# Patient Record
Sex: Male | Born: 1945 | Race: White | Hispanic: No | Marital: Married | Smoking: Never smoker
Health system: Southern US, Community
[De-identification: ages and names within clinical notes are randomized; demographics above are authoritative.]

## PROBLEM LIST (undated history)

## (undated) DIAGNOSIS — M1990 Unspecified osteoarthritis, unspecified site: Secondary | ICD-10-CM

## (undated) DIAGNOSIS — G4730 Sleep apnea, unspecified: Secondary | ICD-10-CM

## (undated) DIAGNOSIS — E785 Hyperlipidemia, unspecified: Secondary | ICD-10-CM

## (undated) DIAGNOSIS — I10 Essential (primary) hypertension: Secondary | ICD-10-CM

## (undated) HISTORY — DX: Hyperlipidemia, unspecified: E78.5

## (undated) HISTORY — PX: OTHER SURGICAL HISTORY: SHX169

---

## 2003-06-03 ENCOUNTER — Encounter: Admission: RE | Admit: 2003-06-03 | Discharge: 2003-06-03 | Payer: Self-pay | Admitting: Family Medicine

## 2008-03-26 ENCOUNTER — Encounter: Admission: RE | Admit: 2008-03-26 | Discharge: 2008-03-26 | Payer: Self-pay | Admitting: Family Medicine

## 2013-05-27 ENCOUNTER — Other Ambulatory Visit: Payer: Self-pay | Admitting: Gastroenterology

## 2013-07-07 ENCOUNTER — Encounter (HOSPITAL_COMMUNITY): Payer: Self-pay | Admitting: Pharmacy Technician

## 2013-07-16 ENCOUNTER — Encounter (HOSPITAL_COMMUNITY): Payer: Self-pay | Admitting: *Deleted

## 2013-07-28 ENCOUNTER — Ambulatory Visit (HOSPITAL_COMMUNITY)
Admission: RE | Admit: 2013-07-28 | Discharge: 2013-07-28 | Disposition: A | Discharge status: Home / Self Care | Payer: Medicare HMO | Source: Ambulatory Visit | Attending: Gastroenterology | Admitting: Gastroenterology

## 2013-07-28 ENCOUNTER — Encounter (HOSPITAL_COMMUNITY)
Admission: RE | Disposition: A | Discharge status: Home / Self Care | Payer: Self-pay | Source: Ambulatory Visit | Attending: Gastroenterology

## 2013-07-28 ENCOUNTER — Encounter (HOSPITAL_COMMUNITY): Payer: Medicare HMO | Admitting: Anesthesiology

## 2013-07-28 ENCOUNTER — Ambulatory Visit (HOSPITAL_COMMUNITY): Payer: Medicare HMO | Admitting: Anesthesiology

## 2013-07-28 ENCOUNTER — Encounter (HOSPITAL_COMMUNITY): Payer: Self-pay | Admitting: *Deleted

## 2013-07-28 DIAGNOSIS — M1990 Unspecified osteoarthritis, unspecified site: Secondary | ICD-10-CM | POA: Insufficient documentation

## 2013-07-28 DIAGNOSIS — G4700 Insomnia, unspecified: Secondary | ICD-10-CM | POA: Insufficient documentation

## 2013-07-28 DIAGNOSIS — I10 Essential (primary) hypertension: Secondary | ICD-10-CM | POA: Insufficient documentation

## 2013-07-28 DIAGNOSIS — L259 Unspecified contact dermatitis, unspecified cause: Secondary | ICD-10-CM | POA: Insufficient documentation

## 2013-07-28 DIAGNOSIS — E7800 Pure hypercholesterolemia, unspecified: Secondary | ICD-10-CM | POA: Insufficient documentation

## 2013-07-28 DIAGNOSIS — Z1211 Encounter for screening for malignant neoplasm of colon: Secondary | ICD-10-CM | POA: Insufficient documentation

## 2013-07-28 HISTORY — DX: Unspecified osteoarthritis, unspecified site: M19.90

## 2013-07-28 HISTORY — DX: Essential (primary) hypertension: I10

## 2013-07-28 HISTORY — PX: COLONOSCOPY WITH PROPOFOL: SHX5780

## 2013-07-28 SURGERY — COLONOSCOPY WITH PROPOFOL
Anesthesia: Monitor Anesthesia Care

## 2013-07-28 MED ORDER — SODIUM CHLORIDE 0.9 % IV SOLN: INTRAVENOUS | Status: DC

## 2013-07-28 MED ORDER — PROPOFOL 10 MG/ML IV BOLUS
INTRAVENOUS | Status: AC
  Filled 2013-07-28: qty 20

## 2013-07-28 MED ORDER — LIDOCAINE HCL (CARDIAC) 20 MG/ML IV SOLN
INTRAVENOUS | Status: AC
  Filled 2013-07-28: qty 5

## 2013-07-28 MED ORDER — PROPOFOL INFUSION 10 MG/ML OPTIME
INTRAVENOUS | Status: DC | PRN
  Administered 2013-07-28: 140 ug/kg/min via INTRAVENOUS

## 2013-07-28 MED ORDER — LACTATED RINGERS IV SOLN
INTRAVENOUS | Status: DC
  Administered 2013-07-28: 1000 mL via INTRAVENOUS

## 2013-07-28 MED ORDER — LACTATED RINGERS IV SOLN
INTRAVENOUS | Status: DC | PRN
  Administered 2013-07-28: 10:00:00 via INTRAVENOUS

## 2013-07-28 SURGICAL SUPPLY — 21 items

## 2013-07-28 NOTE — Op Note (Signed)
Procedure: Screening colonoscopy. Normal screening colonoscopy performed 09/20/2003.  Endoscopist: Earle Gell  Premedication: Propofol administered by anesthesia  Procedure: The patient was placed in the left lateral decubitus position. Anal inspection and digital rectal exam were normal. The Pentax pediatric colonoscope was introduced into the rectum and advanced to the cecum. A normal-appearing ileocecal valve and appendiceal orifice were identified. Colonic preparation for the exam today was good.  Rectum. Normal. Retroflexed view of the distal rectum normal  Sigmoid colon and descending colon. Normal  Splenic flexure. Normal  Transverse colon. Normal  Hepatic flexure. Normal  Ascending colon. Normal  Cecum and ileocecal valve. Normal  Assessment: Normal screening proctocolonoscopy to the cecum  Recommendation: Schedule repeat screening colonoscopy in 10 years if the patient's health allows.

## 2013-07-28 NOTE — Anesthesia Preprocedure Evaluation (Addendum)
Anesthesia Evaluation  Patient identified by MRN, date of birth, ID band Patient awake    Reviewed: Allergy & Precautions, H&P , NPO status , Patient's Chart, lab work & pertinent test results  Airway Mallampati: II TM Distance: >3 FB Neck ROM: full    Dental  (+) Caps, Dental Advisory Given All upper front teeth are capped:   Pulmonary neg pulmonary ROS,  breath sounds clear to auscultation  Pulmonary exam normal       Cardiovascular Exercise Tolerance: Good hypertension, Pt. on medications Rhythm:regular Rate:Normal     Neuro/Psych negative neurological ROS  negative psych ROS   GI/Hepatic negative GI ROS, Neg liver ROS,   Endo/Other  negative endocrine ROS  Renal/GU negative Renal ROS  negative genitourinary   Musculoskeletal   Abdominal   Peds  Hematology negative hematology ROS (+)   Anesthesia Other Findings   Reproductive/Obstetrics negative OB ROS                          Anesthesia Physical Anesthesia Plan  ASA: II  Anesthesia Plan: MAC   Post-op Pain Management:    Induction:   Airway Management Planned:   Additional Equipment:   Intra-op Plan:   Post-operative Plan:   Informed Consent: I have reviewed the patients History and Physical, chart, labs and discussed the procedure including the risks, benefits and alternatives for the proposed anesthesia with the patient or authorized representative who has indicated his/her understanding and acceptance.   Dental Advisory Given  Plan Discussed with: CRNA and Surgeon  Anesthesia Plan Comments:         Anesthesia Quick Evaluation

## 2013-07-28 NOTE — Anesthesia Postprocedure Evaluation (Signed)
  Anesthesia Post-op Note  Patient: Kevin Ramirez  Procedure(s) Performed: Procedure(s) (LRB): COLONOSCOPY WITH PROPOFOL (N/A)  Patient Location: PACU  Anesthesia Type: MAC  Level of Consciousness: awake and alert   Airway and Oxygen Therapy: Patient Spontanous Breathing  Post-op Pain: mild  Post-op Assessment: Post-op Vital signs reviewed, Patient's Cardiovascular Status Stable, Respiratory Function Stable, Patent Airway and No signs of Nausea or vomiting  Last Vitals:  Filed Vitals:   07/28/13 1037  BP: 116/73  Pulse:   Temp: 36.3 C  Resp: 12    Post-op Vital Signs: stable   Complications: No apparent anesthesia complications

## 2013-07-28 NOTE — Transfer of Care (Signed)
Immediate Anesthesia Transfer of Care Note  Patient: Kevin Ramirez  Procedure(s) Performed: Procedure(s): COLONOSCOPY WITH PROPOFOL (N/A)  Patient Location: PACU  Anesthesia Type:MAC  Level of Consciousness: sedated  Airway & Oxygen Therapy: Patient Spontanous Breathing and Patient connected to face mask oxygen  Post-op Assessment: Report given to PACU RN and Post -op Vital signs reviewed and stable  Post vital signs: Reviewed and stable  Complications: No apparent anesthesia complications

## 2013-07-28 NOTE — Discharge Instructions (Signed)
Colonoscopy A colonoscopy is an exam to look at the entire large intestine (colon). This exam can help find problems such as tumors, polyps, inflammation, and areas of bleeding. The exam takes about 1 hour.  LET [facility] CARE PROVIDER KNOW ABOUT:   Any allergies you have.  All medicines you are taking, including vitamins, herbs, eye drops, creams, and over-the-counter medicines.  Previous problems you or members of your family have had with the use of anesthetics.  Any blood disorders you have.  Previous surgeries you have had.  Medical conditions you have. RISKS AND COMPLICATIONS  Generally, this is a safe procedure. However, as with any procedure, complications can occur. Possible complications include:  Bleeding.  Tearing or rupture of the colon wall.  Reaction to medicines given during the exam.  Infection (rare). BEFORE THE PROCEDURE   Ask your health care provider about changing or stopping your regular medicines.  You may be prescribed an oral bowel prep. This involves drinking a large amount of medicated liquid, starting the day before your procedure. The liquid will cause you to have multiple loose stools until your stool is almost clear or light green. This cleans out your colon in preparation for the procedure.  Do not eat or drink anything else once you have started the bowel prep, unless your health care provider tells you it is safe to do so.  Arrange for someone to drive you home after the procedure. PROCEDURE   You will be given medicine to help you relax (sedative).  You will lie on your side with your knees bent.  A long, flexible tube with a light and camera on the end (colonoscope) will be inserted through the rectum and into the colon. The camera sends video back to a computer screen as it moves through the colon. The colonoscope also releases carbon dioxide gas to inflate the colon. This helps your health care provider see the area better.  During  the exam, your health care provider may take a small tissue sample (biopsy) to be examined under a microscope if any abnormalities are found.  The exam is finished when the entire colon has been viewed. AFTER THE PROCEDURE   Do not drive for 24 hours after the exam.  You may have a small amount of blood in your stool.  You may pass moderate amounts of gas and have mild abdominal cramping or bloating. This is caused by the gas used to inflate your colon during the exam.  Ask when your test results will be ready and how you will get your results. Make sure you get your test results. Document Released: 02/03/2000 Document Revised: 11/26/2012 Document Reviewed: 10/13/2012 [facility] Patient Information 2014 [place]. Monitored Anesthesia Care  Monitored anesthesia care is an anesthesia service for a medical procedure. Anesthesia is the loss of the ability to feel pain. It is produced by medications called anesthetics. It may affect a small area of your body (local anesthesia), a large area of your body (regional anesthesia), or your entire body (general anesthesia). The need for monitored anesthesia care depends your procedure, your condition, and the potential need for regional or general anesthesia. It is often provided during procedures where:   General anesthesia may be needed if there are complications. This is because you need special care when you are under general anesthesia.   You will be under local or regional anesthesia. This is so that you are able to have higher levels of anesthesia if needed.   You  will receive calming medications (sedatives). This is especially the case if sedatives are given to put you in a semi-conscious state of relaxation (deep sedation). This is because the amount of sedative needed to produce this state can be hard to predict. Too much of a sedative can produce general anesthesia. Monitored anesthesia care is performed by one or more caregivers who have  special training in all types of anesthesia. You will need to meet with these caregivers before your procedure. During this meeting, they will ask you about your medical history. They will also give you instructions to follow. (For example, you will need to stop eating and drinking before your procedure. You may also need to stop or change medications you are taking.) During your procedure, your caregivers will stay with you. They will:   Watch your condition. This includes watching you blood pressure, breathing, and level of pain.   Diagnose and treat problems that occur.   Give medications if they are needed. These may include calming medications (sedatives) and anesthetics.   Make sure you are comfortable.  Having monitored anesthesia care does not necessarily mean that you will be under anesthesia. It does mean that your caregivers will be able to manage anesthesia if you need it or if it occurs. It also means that you will be able to have a different type of anesthesia than you are having if you need it. When your procedure is complete, your caregivers will continue to watch your condition. They will make sure any medications wear off before you are allowed to go home.  Document Released: 11/01/2004 Document Revised: 06/02/2012 Document Reviewed: 03/19/2012 [facility] Patient Information 2014 [place], [place].

## 2013-07-28 NOTE — H&P (Signed)
Procedure: Screening colonoscopy. Normal screening colonoscopy performed on 09/20/2003  History: The patient is a 68 year old male born Apr 09, 1945 who is scheduled to undergo a repeat screening colonoscopy with polypectomy to prevent colon cancer.  Medication allergies:Hycodan causes nausea  Past medical history: Hypertension. Hypercholesterolemia. Eczema. Osteoarthritis. Insomnia. Chest surgery.  Exam: The patient is alert and lying comfortably on the endoscopy stretcher. Lungs are clear to auscultation. Cardiac exam reveals a regular rhythm. Abdomen is soft and nontender to palpation.  Plan: Proceed with screening colonoscopy

## 2013-07-29 ENCOUNTER — Encounter (HOSPITAL_COMMUNITY): Payer: Self-pay | Admitting: Gastroenterology

## 2014-05-29 ENCOUNTER — Ambulatory Visit (INDEPENDENT_AMBULATORY_CARE_PROVIDER_SITE_OTHER): Payer: Medicare HMO

## 2014-05-29 ENCOUNTER — Encounter: Payer: Self-pay | Admitting: Podiatry

## 2014-05-29 ENCOUNTER — Ambulatory Visit (INDEPENDENT_AMBULATORY_CARE_PROVIDER_SITE_OTHER): Payer: Medicare HMO | Admitting: Podiatry

## 2014-05-29 VITALS — BP 127/83 | HR 64 | Resp 18

## 2014-05-29 DIAGNOSIS — M6740 Ganglion, unspecified site: Secondary | ICD-10-CM

## 2014-05-29 DIAGNOSIS — R52 Pain, unspecified: Secondary | ICD-10-CM

## 2014-05-29 DIAGNOSIS — B351 Tinea unguium: Secondary | ICD-10-CM | POA: Diagnosis not present

## 2014-05-29 NOTE — Progress Notes (Signed)
Subjective:    Patient ID: Kevin Ramirez, male    DOB: [birth year], 69 y.o.   MRN: [id number]  HPI  69 year old male presents the office they with complaints of a growth on his left foot which has been present for approximate one month. He states that over time his been getting bigger and he has pain particularly with shoe gear and pressure. He denies any overlying skin changes. Denies any history of injury or trauma. He has a states that he has thick discolored toenails is present for several years. He states they're very brittle. Denies any pain associated with him and denies any redness or drainage. No other complaints at this time.    Review of Systems  All other systems reviewed and are negative.      Objective:   Physical Exam AAO 3, NAD DP/PT pulses palpable, CRT less than 3 seconds Protective sensation intact with Simms 1 2 monofilament, vibratory sensation intact, Achilles tendon reflex intact. On the dorsal aspect left foot on the forefoot there is a well encapsulated fluid-filled soft tissue mass measures proximally 1.2 x 1.2 cm. There is mild discomfort upon palpation this lesion. There is no overlying skin change. No overlying erythema, edema, increase in warmth. Nails are hypertrophic, dystrophic, elongated, brittle, discolored 10. There is no swelling erythema or drainage from the nail sites. No open lesions or pre-ulcer lesions identified bilaterally. No other areas of tenderness to bilateral lower extremities. MMT 5/5, ROM WNL No pain with calf compression, swelling, warmth, erythema.    Assessment & Plan:  69 year old male left foot soft tissue mass/ganglion cyst; onychodystrophy, likely onychomycosis -X-rays were obtained and reviewed with the patient -Treatment options discussed include alternatives, risks, competitions -In regards the soft tissue mass appears to be a ganglion cyst. I discussed aspiration with the patient including risks and complications for  which he understands and wishes to proceed. He probably consented. Under sterile conditions a total of 1.5 mL mixture of 2% lidocaine plain and 0.5% Marcaine plan was infiltrated in a regional block fashion around the soft tissue mass. Once anesthetized the skin was prepped with Betadine. Next an 18-gauge needle was utilized to aspirate the soft tissue mass was there was clear gel-like fluid aspirated. Appear to be a ganglion cyst. After aspiration a total of 0.5 mL of dexamethasone phosphate was infiltrated into the area. Bandage was applied followed by compression dressing. Post procedure care was discussed the patient. -In regards to the onychomycosis the nails were biopsied and sent to [facility] labs for evaluation. Discussed there is treatment options of onychomycosis with the patient however we will await the results of the biopsy before proceeding with treatment. -Follow-up after nail biopsy results are obtained or sooner if any problems are to arise. In the meantime encouraged to call the office with any questions, concerns, change in symptoms.

## 2014-05-29 NOTE — Patient Instructions (Addendum)
Ganglion Cyst A ganglion cyst is a noncancerous, fluid-filled lump that occurs near joints or tendons. The ganglion cyst grows out of a joint or the lining of a tendon. It most often develops in the hand or wrist but can also develop in the shoulder, elbow, hip, knee, ankle, or foot. The round or oval ganglion can be pea sized or larger than a grape. Increased activity may enlarge the size of the cyst because more fluid starts to build up.  CAUSES  It is not completely known what causes a ganglion cyst to grow. However, it may be related to: 1. Inflammation or irritation around the joint. 2. An injury. 3. Repetitive movements or overuse. 4. Arthritis. SYMPTOMS  A lump most often appears in the hand or wrist, but can occur in other areas of the body. Generally, the lump is painless without other symptoms. However, sometimes pain can be felt during activity or when pressure is applied to the lump. The lump may even be tender to the touch. Tingling, pain, numbness, or muscle weakness can occur if the ganglion cyst presses on a nerve. Your grip may be weak and you may have less movement in your joints.  DIAGNOSIS  Ganglion cysts are most often diagnosed based on a physical exam, noting where the cyst is and how it looks. Your caregiver will feel the lump and may shine a light alongside it. If it is a ganglion, a light often shines through it. Your caregiver may order an X-ray, ultrasound, or MRI to rule out other conditions. TREATMENT  Ganglions usually go away on their own without treatment. If pain or other symptoms are involved, treatment may be needed. Treatment is also needed if the ganglion limits your movement or if it gets infected. Treatment options include:  Wearing a wrist or finger brace or splint.  Taking anti-inflammatory medicine.  Draining fluid from the lump with a needle (aspiration).  Injecting a steroid into the joint.  Surgery to remove the ganglion cyst and its stalk that is  attached to the joint or tendon. However, ganglion cysts can grow back. HOME CARE INSTRUCTIONS   Do not press on the ganglion, poke it with a needle, or hit it with a heavy object. You may rub the lump gently and often. Sometimes fluid moves out of the cyst.  Only take medicines as directed by your caregiver.  Wear your brace or splint as directed by your caregiver. SEEK MEDICAL CARE IF:   Your ganglion becomes larger or more painful.  You have increased redness, red streaks, or swelling.  You have pus coming from the lump.  You have weakness or numbness in the affected area. MAKE SURE YOU:   Understand these instructions.  Will watch your condition.  Will get help right away if you are not doing well or get worse. Document Released: 02/03/2000 Document Revised: 10/31/2011 Document Reviewed: 04/01/2007 [facility] Patient Information 2015 [place], [place]. This information is not intended to replace advice given to you by your health care provider. Make sure you discuss any questions you have with your health care provider. Onychomycosis/Fungal Toenails  WHAT IS IT? An infection that lies within the keratin of your nail plate that is caused by a fungus.  WHY ME? Fungal infections affect all ages, sexes, races, and creeds.  There may be many factors that predispose you to a fungal infection such as age, coexisting medical conditions such as diabetes, or an autoimmune disease; stress, medications, fatigue, genetics, etc.  Bottom line: fungus  thrives in a warm, moist environment and your shoes offer such a location.  IS IT CONTAGIOUS? Theoretically, yes.  You do not want to share shoes, nail clippers or files with someone who has fungal toenails.  Walking around barefoot in the same room or sleeping in the same bed is unlikely to transfer the organism.  It is important to realize, however, that fungus can spread easily from one nail to the next on the same foot.  HOW DO WE TREAT THIS?   There are several ways to treat this condition.  Treatment may depend on many factors such as age, medications, pregnancy, liver and kidney conditions, etc.  It is best to ask your doctor which options are available to you.  5. No treatment.   Unlike many other medical concerns, you can live with this condition.  However for many people this can be a painful condition and may lead to ingrown toenails or a bacterial infection.  It is recommended that you keep the nails cut short to help reduce the amount of fungal nail. 6. Topical treatment.  These range from herbal remedies to prescription strength nail lacquers.  About 40-50% effective, topicals require twice daily application for approximately 9 to 12 months or until an entirely new nail has grown out.  The most effective topicals are medical grade medications available through physicians offices. 7. Oral antifungal medications.  With an 80-90% cure rate, the most common oral medication requires 3 to 4 months of therapy and stays in your system for a year as the new nail grows out.  Oral antifungal medications do require blood work to make sure it is a safe drug for you.  A liver function panel will be performed prior to starting the medication and after the first month of treatment.  It is important to have the blood work performed to avoid any harmful side effects.  In general, this medication safe but blood work is required. 8. Laser Therapy.  This treatment is performed by applying a specialized laser to the affected nail plate.  This therapy is noninvasive, fast, and non-painful.  It is not covered by insurance and is therefore, out of pocket.  The results have been very good with a 80-95% cure rate.  The [facility] is the only practice in the area to offer this therapy. 9. Permanent Nail Avulsion.  Removing the entire nail so that a new nail will not grow back.

## 2014-05-30 ENCOUNTER — Encounter: Payer: Self-pay | Admitting: Podiatry

## 2014-06-16 ENCOUNTER — Ambulatory Visit: Payer: Medicare HMO | Admitting: Podiatry

## 2014-07-05 ENCOUNTER — Encounter: Payer: Self-pay | Admitting: Podiatry

## 2014-07-05 ENCOUNTER — Ambulatory Visit (INDEPENDENT_AMBULATORY_CARE_PROVIDER_SITE_OTHER): Payer: Medicare HMO | Admitting: Podiatry

## 2014-07-05 VITALS — BP 139/85 | HR 61 | Resp 18

## 2014-07-05 DIAGNOSIS — M6740 Ganglion, unspecified site: Secondary | ICD-10-CM

## 2014-07-05 DIAGNOSIS — B351 Tinea unguium: Secondary | ICD-10-CM | POA: Diagnosis not present

## 2014-07-05 MED ORDER — TERBINAFINE HCL 250 MG PO TABS: 250.0000 mg | ORAL_TABLET | Freq: Every day | ORAL | Status: DC

## 2014-07-05 NOTE — Patient Instructions (Signed)

## 2014-07-07 ENCOUNTER — Telehealth: Payer: Self-pay | Admitting: *Deleted

## 2014-07-07 ENCOUNTER — Encounter: Payer: Self-pay | Admitting: Podiatry

## 2014-07-07 NOTE — Telephone Encounter (Signed)
"  We have a culture here.  We need to know the location of the specimen."  It is the dorsal forefoot left.  "Okay thank you."

## 2014-07-07 NOTE — Progress Notes (Signed)
Patient ID: Kevin Ramirez, male   DOB: Apr 18, 1945, 69 y.o.   MRN: [id number]  Subjective: 69 year old male presents the office they for follow-up evaluation of left foot ganglion cyst for which he states has recurred somewhat since last appointment. He states is not as big as what it was however inserted to grow causing pain particularly with shoe gear. Denies any redness or drainage from the site no skin changes. He is also here to discuss nail biopsy results. No other complaints at this time. Denies any systemic complaints such as fevers, chills, nausea, vomiting. No acute changes since last appointment, and no other complaints at this time.   Objective: AAO x3, NAD DP/PT pulses palpable bilaterally, CRT less than 3 seconds Protective sensation intact with Simms Weinstein monofilament, vibratory sensation intact, Achilles tendon reflex intact On the dorsal aspect left forefoot there is a well but fluid-filled soft tissue mass measuring approximately 1 x 1 cm. There is mild discomfort palpation overlying lesion. There is no overlying skin change. Nails are hypertrophic, dystrophic, brittle, discolored, elongated. There is no tenderness palpation along the nails there is no swelling erythema or drainage. No areas of pinpoint bony tenderness or pain with vibratory sensation. MMT 5/5, ROM WNL. No edema, erythema, increase in warmth to bilateral lower extremities.  No open lesions or pre-ulcerative lesions.  No pain with calf compression, swelling, warmth, erythema  Assessment: 69 year old male with recurrence of left foot likely cyst; onychomycosis Plan: -All treatment options discussed with the patient including all alternatives, risks, complications.  -This time the patient is requesting of the cyst be drained again. I discussed with him surgical intervention however he wishes to hold off on that at this time. Risks and complications of the aspiration were discussed the patient. Under sterile  conditions a total of 1 mL mixture of 2% lidocaine plain and 0.5% Marcaine plain was infiltrated in a regional block fashion around the soft tissue mass. Area was then prepped with Betadine. An 18-gauge needle was then utilized to puncture the soft tissue mass and the mass was aspirated to reveal clear, jellylike fluid. 1 mL Kenalog-10 was then infiltrated. A Band-Aid was applied followed by compression dressing. Patient tolerated this without any complications. -I discussed nail biopsy results with the patient. At this time easily to proceed with Lamisil. I discussed with him side effects and risks of the medication and that being on statin increases risks. He understands this and wishes to proceed. The patient was given prescription for blood work as well as 30 days of Lamisil. -Follow-up in 4 weeks or sooner if any problems are to arise. -Patient encouraged to call the office with any questions, concerns, change in symptoms.

## 2014-07-08 LAB — PATHOLOGY

## 2014-08-02 ENCOUNTER — Ambulatory Visit: Payer: Medicare HMO | Admitting: Podiatry

## 2014-09-03 LAB — CBC WITH DIFFERENTIAL/PLATELET
BASOS ABS: 0 10*3/uL (ref 0.0–0.1)
Basophils Relative: 0 % (ref 0–1)
EOS ABS: 0.2 10*3/uL (ref 0.0–0.7)
EOS PCT: 3 % (ref 0–5)
HCT: 42.8 % (ref 39.0–52.0)
Hemoglobin: 14.4 g/dL (ref 13.0–17.0)
LYMPHS ABS: 2.4 10*3/uL (ref 0.7–4.0)
Lymphocytes Relative: 33 % (ref 12–46)
MCH: 29 pg (ref 26.0–34.0)
MCHC: 33.6 g/dL (ref 30.0–36.0)
MCV: 86.1 fL (ref 78.0–100.0)
MONOS PCT: 8 % (ref 3–12)
MPV: 9.7 fL (ref 8.6–12.4)
Monocytes Absolute: 0.6 10*3/uL (ref 0.1–1.0)
NEUTROS PCT: 56 % (ref 43–77)
Neutro Abs: 4.1 10*3/uL (ref 1.7–7.7)
Platelets: 259 10*3/uL (ref 150–400)
RBC: 4.97 MIL/uL (ref 4.22–5.81)
RDW: 13.9 % (ref 11.5–15.5)
WBC: 7.3 10*3/uL (ref 4.0–10.5)

## 2014-09-04 LAB — HEPATIC FUNCTION PANEL
ALT: 22 U/L (ref 0–53)
AST: 22 U/L (ref 0–37)
Albumin: 4.2 g/dL (ref 3.5–5.2)
Alkaline Phosphatase: 69 U/L (ref 39–117)
BILIRUBIN DIRECT: 0.1 mg/dL (ref 0.0–0.3)
BILIRUBIN TOTAL: 0.6 mg/dL (ref 0.2–1.2)
Indirect Bilirubin: 0.5 mg/dL (ref 0.2–1.2)
Total Protein: 6.8 g/dL (ref 6.0–8.3)

## 2014-09-07 ENCOUNTER — Telehealth: Payer: Self-pay | Admitting: *Deleted

## 2014-09-07 NOTE — Telephone Encounter (Signed)
Dr. Jacqualyn Posey reviewed pt's bloodwork of 09/03/2014 as normal and ordered pt can continue the Lamisil.  Orders called to pt.

## 2014-10-04 ENCOUNTER — Ambulatory Visit (INDEPENDENT_AMBULATORY_CARE_PROVIDER_SITE_OTHER): Payer: Medicare HMO | Admitting: Podiatry

## 2014-10-04 ENCOUNTER — Encounter: Payer: Self-pay | Admitting: Podiatry

## 2014-10-04 VITALS — BP 138/117 | HR 69 | Resp 18

## 2014-10-04 DIAGNOSIS — Z79899 Other long term (current) drug therapy: Secondary | ICD-10-CM

## 2014-10-04 DIAGNOSIS — B351 Tinea unguium: Secondary | ICD-10-CM | POA: Diagnosis not present

## 2014-10-04 DIAGNOSIS — M6740 Ganglion, unspecified site: Secondary | ICD-10-CM | POA: Diagnosis not present

## 2014-10-04 MED ORDER — TERBINAFINE HCL 250 MG PO TABS: 250.0000 mg | ORAL_TABLET | Freq: Every day | ORAL | Status: DC

## 2014-10-04 NOTE — Progress Notes (Signed)
Patient ID: Kevin Ramirez, male   DOB: Jan 28, 1946, 69 y.o.   MRN: [id number]  Subjective: Patient presents the office today one month after starting Lamisil. He denies any side effects the medication although he has not edema to the differences toenails. He also states the shot did not help to the system the left foot and he does have some throbbing pain after prolonged activity. Denies any swelling or redness over the area. No other complaints at this time. No acute changes.  Objective: AAO 3, NAD DP/PT pulses are palpable, CRT less than 3 seconds  Protective sensation intact. Nails continue be hypertrophic, dystrophic, discolored. There is no tenderness to palpation around the nails and there is no swelling erythema or drainage. On the dorsal aspect of the left forefoot there is a small firm mobile soft tissue mass. There is no tenderness to palpation along the area at this time and there is no overlying erythema or skin changes. No other areas of tenderness to bilateral lower extremities. There is no overlying edema, erythema, increase in warmth. No open lesions or pre-ulcerative lesions.  Assessment: 69 year old male onychomycosis, left soft tissue mass/ganglion cyst  Plan: At this time is completed one month of Lamisil. I prescribed new bloodwork for CBC and LFT as well as a 60 day supply Lamisil. He has not started a new supply Lamisil and so I call him with results of blood work. He verbally understood this. I again discussed side effects the medication and directed to stop immediately if any are to occur and call the office. I discussed various treatment options for the cyst on the left foot. At this time he wishes to hold off any further treatment he'll likely have surgery in the future to remove the mass. Follow-up in 2 months or sooner if any problems are to arise. Call if questions, concerns, change in symptoms.  Celesta Gentile, DPM

## 2014-10-04 NOTE — Patient Instructions (Signed)

## 2014-10-14 LAB — CBC WITH DIFFERENTIAL/PLATELET
BASOS ABS: 0 10*3/uL (ref 0.0–0.1)
Basophils Relative: 0 % (ref 0–1)
Eosinophils Absolute: 0.4 10*3/uL (ref 0.0–0.7)
Eosinophils Relative: 6 % — ABNORMAL HIGH (ref 0–5)
HEMATOCRIT: 41.5 % (ref 39.0–52.0)
HEMOGLOBIN: 14.4 g/dL (ref 13.0–17.0)
Lymphocytes Relative: 23 % (ref 12–46)
Lymphs Abs: 1.6 10*3/uL (ref 0.7–4.0)
MCH: 29.6 pg (ref 26.0–34.0)
MCHC: 34.7 g/dL (ref 30.0–36.0)
MCV: 85.4 fL (ref 78.0–100.0)
MONO ABS: 0.8 10*3/uL (ref 0.1–1.0)
MPV: 9.7 fL (ref 8.6–12.4)
Monocytes Relative: 11 % (ref 3–12)
NEUTROS ABS: 4.2 10*3/uL (ref 1.7–7.7)
NEUTROS PCT: 60 % (ref 43–77)
Platelets: 245 10*3/uL (ref 150–400)
RBC: 4.86 MIL/uL (ref 4.22–5.81)
RDW: 13.5 % (ref 11.5–15.5)
WBC: 7 10*3/uL (ref 4.0–10.5)

## 2014-10-14 LAB — BASIC METABOLIC PANEL
BUN: 15 mg/dL (ref 7–25)
CHLORIDE: 101 mmol/L (ref 98–110)
CO2: 27 mmol/L (ref 20–31)
Calcium: 9.1 mg/dL (ref 8.6–10.3)
Creat: 1.1 mg/dL (ref 0.70–1.18)
GLUCOSE: 83 mg/dL (ref 65–99)
POTASSIUM: 4.1 mmol/L (ref 3.5–5.3)
Sodium: 142 mmol/L (ref 135–146)

## 2014-10-19 ENCOUNTER — Telehealth: Payer: Self-pay | Admitting: *Deleted

## 2014-10-19 DIAGNOSIS — Z79899 Other long term (current) drug therapy: Secondary | ICD-10-CM

## 2014-10-19 NOTE — Telephone Encounter (Signed)
I informed pt the Hepatic Function test had not been ordered, I would order them now and he could get them at his convenience.

## 2014-10-27 ENCOUNTER — Telehealth: Payer: Self-pay | Admitting: *Deleted

## 2014-10-27 NOTE — Telephone Encounter (Addendum)
Pt states he received a call stating he needed to have a liver function test drawn, and he wanted to make certain he would not be charged again.  I reviewed pt's lab orders and 10/14/2014 CBC with Diff was ordered, but no liver function test was ordered so pt will be charged for the test, but not as a duplicate. I will have my office manager contact the pt.

## 2014-10-30 LAB — HEPATIC FUNCTION PANEL
ALK PHOS: 84 U/L (ref 40–115)
ALT: 24 U/L (ref 9–46)
AST: 22 U/L (ref 10–35)
Albumin: 4.5 g/dL (ref 3.6–5.1)
BILIRUBIN DIRECT: 0.1 mg/dL (ref <=0.2)
BILIRUBIN INDIRECT: 0.6 mg/dL (ref 0.2–1.2)
TOTAL PROTEIN: 6.9 g/dL (ref 6.1–8.1)
Total Bilirubin: 0.7 mg/dL (ref 0.2–1.2)

## 2014-11-01 ENCOUNTER — Telehealth: Payer: Self-pay | Admitting: *Deleted

## 2014-11-01 NOTE — Telephone Encounter (Addendum)
-----   Message from Trula Slade, DPM sent at 11/01/2014  6:42 AM EDT ----- OK to continue lamisl. Please let him know. Thanks.   Left message I would try the mobile phone.  Left message on pt's mobile informing him I was calling concerning his labs.  I informed pt of the recommendation of Dr. Jacqualyn Posey.

## 2014-12-06 ENCOUNTER — Encounter: Payer: Self-pay | Admitting: Podiatry

## 2014-12-06 ENCOUNTER — Ambulatory Visit (INDEPENDENT_AMBULATORY_CARE_PROVIDER_SITE_OTHER): Payer: Medicare HMO | Admitting: Podiatry

## 2014-12-06 VITALS — BP 127/79 | HR 81 | Resp 18

## 2014-12-06 DIAGNOSIS — M6740 Ganglion, unspecified site: Secondary | ICD-10-CM

## 2014-12-06 DIAGNOSIS — B351 Tinea unguium: Secondary | ICD-10-CM

## 2014-12-07 ENCOUNTER — Encounter: Payer: Self-pay | Admitting: Podiatry

## 2014-12-07 NOTE — Progress Notes (Signed)
Patient ID: Kevin Ramirez, male   DOB: 06-30-1945, 69 y.o.   MRN: [id number]  Subjective: Patient presents the office they for follow-up evaluation of onychomycosis. He states he is finishing his third month of Lamisil. He states he has not noticed much change was toenail since starting the medication. He has a states his toenails have not grown much. He denies any pain to the toenails denies any surrounding redness or drainage. No other complaints at this time in no acute changes otherwise.  Objective: AAO 3, NAD DP/PT pulses 2/4, CRT less than 3 seconds Neurological status unchanged Nails continue be hypertrophic, dystrophic, brittle, discolored 10. There is not appear to be much clearing on the proximal nail borders. There is no tenderness to palpation around the nails there is no swelling erythema or drainage. No open lesions or pre-ulcer lesions identified bilaterally. On the dorsal aspect left forefoot is a fluid-filled, mobile soft tissue mass consistent with a ganglion cyst. There is no tenderness around the area and there is no overlying skin changes. No areas of tenderness to bilateral lower extremity is. No overlying edema, erythema, increase in warmth bilaterally. There is no pain with calf compression, swelling, warmth, erythema.  Assessment: 69 year old male with continuation of onychomycosis, finishing Lamisil; ganglion cyst  Plan: -Treatment options discussed including all alternatives, risks, and complications -I discussed various treatment options at this point for onychomycosis. I discussed with him the fourth month of Lamisil however he has not had much change in 3 months so I do not think that extending 1 more month will help. He agrees. I discussed switching to itraconazole however, I would like to hold off a couple months and all the nails to grow out and seated to be any improvement after the Lamisil. I discussed topical treatment. He wishes to hold off on any further  treatment for the next couple months and see what happens to the toenails and see if that nails improved. -I discussed aspiration versus surgical excision. We have previously aspirated the cyst 2 times it has recurred. I discussed with him surgical intervention. He will likely hold off and we will discuss at his next appointment. He'll be undergoing knee surgery tomorrow. -Follow-up in 3 months or sooner if any problems are to arise. Call any questions or concerns in the meantime.  Celesta Gentile, DPM

## 2014-12-24 DIAGNOSIS — M170 Bilateral primary osteoarthritis of knee: Secondary | ICD-10-CM | POA: Diagnosis not present

## 2014-12-24 DIAGNOSIS — M25562 Pain in left knee: Secondary | ICD-10-CM | POA: Diagnosis not present

## 2014-12-24 DIAGNOSIS — M25561 Pain in right knee: Secondary | ICD-10-CM | POA: Diagnosis not present

## 2014-12-24 DIAGNOSIS — R2689 Other abnormalities of gait and mobility: Secondary | ICD-10-CM | POA: Diagnosis not present

## 2015-01-12 DIAGNOSIS — M25562 Pain in left knee: Secondary | ICD-10-CM | POA: Diagnosis not present

## 2015-01-12 DIAGNOSIS — M25561 Pain in right knee: Secondary | ICD-10-CM | POA: Diagnosis not present

## 2015-01-12 DIAGNOSIS — R2689 Other abnormalities of gait and mobility: Secondary | ICD-10-CM | POA: Diagnosis not present

## 2015-01-12 DIAGNOSIS — M170 Bilateral primary osteoarthritis of knee: Secondary | ICD-10-CM | POA: Diagnosis not present

## 2015-01-24 DIAGNOSIS — G4733 Obstructive sleep apnea (adult) (pediatric): Secondary | ICD-10-CM | POA: Diagnosis not present

## 2015-01-31 DIAGNOSIS — M25562 Pain in left knee: Secondary | ICD-10-CM | POA: Diagnosis not present

## 2015-01-31 DIAGNOSIS — M170 Bilateral primary osteoarthritis of knee: Secondary | ICD-10-CM | POA: Diagnosis not present

## 2015-01-31 DIAGNOSIS — M25561 Pain in right knee: Secondary | ICD-10-CM | POA: Diagnosis not present

## 2015-02-09 DIAGNOSIS — R400 Somnolence: Secondary | ICD-10-CM | POA: Diagnosis not present

## 2015-02-09 DIAGNOSIS — G4733 Obstructive sleep apnea (adult) (pediatric): Secondary | ICD-10-CM | POA: Diagnosis not present

## 2015-03-07 ENCOUNTER — Ambulatory Visit: Payer: Medicare HMO | Admitting: Podiatry

## 2015-03-12 DIAGNOSIS — G4733 Obstructive sleep apnea (adult) (pediatric): Secondary | ICD-10-CM | POA: Diagnosis not present

## 2015-03-12 DIAGNOSIS — R400 Somnolence: Secondary | ICD-10-CM | POA: Diagnosis not present

## 2015-03-28 ENCOUNTER — Ambulatory Visit: Payer: Medicare HMO | Admitting: Podiatry

## 2015-03-31 DIAGNOSIS — H43811 Vitreous degeneration, right eye: Secondary | ICD-10-CM | POA: Diagnosis not present

## 2015-03-31 DIAGNOSIS — H35371 Puckering of macula, right eye: Secondary | ICD-10-CM | POA: Diagnosis not present

## 2015-04-12 DIAGNOSIS — R400 Somnolence: Secondary | ICD-10-CM | POA: Diagnosis not present

## 2015-04-12 DIAGNOSIS — G4733 Obstructive sleep apnea (adult) (pediatric): Secondary | ICD-10-CM | POA: Diagnosis not present

## 2015-04-13 DIAGNOSIS — G4733 Obstructive sleep apnea (adult) (pediatric): Secondary | ICD-10-CM | POA: Diagnosis not present

## 2015-05-05 DIAGNOSIS — N529 Male erectile dysfunction, unspecified: Secondary | ICD-10-CM | POA: Diagnosis not present

## 2015-05-05 DIAGNOSIS — I10 Essential (primary) hypertension: Secondary | ICD-10-CM | POA: Diagnosis not present

## 2015-05-05 DIAGNOSIS — M160 Bilateral primary osteoarthritis of hip: Secondary | ICD-10-CM | POA: Diagnosis not present

## 2015-05-05 DIAGNOSIS — E7800 Pure hypercholesterolemia, unspecified: Secondary | ICD-10-CM | POA: Diagnosis not present

## 2015-05-10 DIAGNOSIS — G4733 Obstructive sleep apnea (adult) (pediatric): Secondary | ICD-10-CM | POA: Diagnosis not present

## 2015-05-10 DIAGNOSIS — R400 Somnolence: Secondary | ICD-10-CM | POA: Diagnosis not present

## 2015-05-12 DIAGNOSIS — G4733 Obstructive sleep apnea (adult) (pediatric): Secondary | ICD-10-CM | POA: Diagnosis not present

## 2015-05-12 DIAGNOSIS — R400 Somnolence: Secondary | ICD-10-CM | POA: Diagnosis not present

## 2015-05-24 ENCOUNTER — Telehealth: Payer: Self-pay | Admitting: *Deleted

## 2015-05-24 NOTE — Telephone Encounter (Signed)
"  I'm a patient of Dr. Jacqualyn Posey.  I'm calling to get an appointment to surgically remove a cyst from the top of my foot.  You can reach me at this number."

## 2015-05-27 NOTE — Telephone Encounter (Signed)
I left patient a message to call and schedule a consultation appointment with Dr. Jacqualyn Posey.  Then we can schedule your procedure.

## 2015-05-30 ENCOUNTER — Ambulatory Visit (INDEPENDENT_AMBULATORY_CARE_PROVIDER_SITE_OTHER): Payer: Medicare HMO | Admitting: Podiatry

## 2015-05-30 ENCOUNTER — Telehealth: Payer: Self-pay | Admitting: *Deleted

## 2015-05-30 ENCOUNTER — Encounter: Payer: Self-pay | Admitting: Podiatry

## 2015-05-30 VITALS — BP 131/82 | HR 66 | Resp 12

## 2015-05-30 DIAGNOSIS — M6740 Ganglion, unspecified site: Secondary | ICD-10-CM | POA: Diagnosis not present

## 2015-05-30 NOTE — Patient Instructions (Signed)

## 2015-05-30 NOTE — Progress Notes (Signed)
Patient ID: Kevin Ramirez, male   DOB: 1946/01/05, 70 y.o.   MRN: [id number]  Subjective: 70 year old male presents the office if her surgical consultation for systems top of the left foot. The areas and aspirated twice however acute becoming reoccurring issue. He states the area becomes pressure was shoes and he is tried shoe changes and offloading the edema as well. At this time to discuss surgical intervention to help decrease his pain. Denies any systemic complaints such as fevers, chills, nausea, vomiting. No acute changes since last appointment, and no other complaints at this time.   Objective: AAO x3, NAD DP/PT pulses palpable bilaterally, CRT less than 3 seconds Protective sensation intact with Simms Weinstein monofilament On the dorsal aspect the left forefoot on the second third metatarsal heads just proximal to this area is a fluid-filled mobile soft tissue mass measured approximately 2 x 2 centimeters. There is no overlying skin change. There is no area pinpoint bony tenderness or pain the vibratory sensation. There is no overlying erythema or increase in warmth. No areas of pinpoint bony tenderness or pain with vibratory sensation. MMT 5/5, ROM WNL. No edema, erythema, increase in warmth to bilateral lower extremities.  No open lesions or pre-ulcerative lesions.  No pain with calf compression, swelling, warmth, erythema  Assessment: 70 year old male likely ganglion cyst left foot, symptomatic  Plan: -All treatment options discussed with the patient including all alternatives, risks, complications.  -This time a discussed with him both conservative and surgical treatment options. The stimulator proceed with surgical intervention to help decrease his pain. I discussed with cyst excision left foot. Do not completely alleviate his symptoms he understands this. -The incision placement as well as the postoperative course was discussed with the patient. I discussed risks of the surgery  which include, but not limited to, infection, bleeding, pain, swelling, need for further surgery, delayed or nonhealing, painful or ugly scar, numbness or sensation changes, over/under correction, recurrence, transfer lesions, further deformity, hardware failure, DVT/PE, loss of toe/foot. Patient understands these risks and wishes to proceed with surgery. The surgical consent was reviewed with the patient all 3 pages were signed. No promises or guarantees were given to the outcome of the procedure. All questions were answered to the best of my ability. Before the surgery the patient was encouraged to call the office if there is any further questions. The surgery will be performed at the [facility] on an outpatient basis. -Patient encouraged to call the office with any questions, concerns, change in symptoms.   Celesta Gentile, DPM

## 2015-05-30 NOTE — Telephone Encounter (Signed)
"  I scheduled surgery with you this morning.  I'm looking to move that surgery up.  I'd like to do it sooner if possible.  I'd like to have it on April 26.  Call and let me know."

## 2015-05-31 NOTE — Telephone Encounter (Signed)
I left patient a message that we will reschedule his surgery to 06/15/2015.  No return call is needed.

## 2015-06-06 DIAGNOSIS — H35371 Puckering of macula, right eye: Secondary | ICD-10-CM | POA: Diagnosis not present

## 2015-06-06 DIAGNOSIS — Z0100 Encounter for examination of eyes and vision without abnormal findings: Secondary | ICD-10-CM | POA: Diagnosis not present

## 2015-06-06 DIAGNOSIS — H2513 Age-related nuclear cataract, bilateral: Secondary | ICD-10-CM | POA: Diagnosis not present

## 2015-06-10 DIAGNOSIS — R400 Somnolence: Secondary | ICD-10-CM | POA: Diagnosis not present

## 2015-06-10 DIAGNOSIS — G4733 Obstructive sleep apnea (adult) (pediatric): Secondary | ICD-10-CM | POA: Diagnosis not present

## 2015-06-14 DIAGNOSIS — G4733 Obstructive sleep apnea (adult) (pediatric): Secondary | ICD-10-CM | POA: Diagnosis not present

## 2015-06-14 DIAGNOSIS — R400 Somnolence: Secondary | ICD-10-CM | POA: Diagnosis not present

## 2015-06-15 ENCOUNTER — Encounter: Payer: Self-pay | Admitting: Podiatry

## 2015-06-15 DIAGNOSIS — I10 Essential (primary) hypertension: Secondary | ICD-10-CM | POA: Diagnosis not present

## 2015-06-15 DIAGNOSIS — M6740 Ganglion, unspecified site: Secondary | ICD-10-CM | POA: Diagnosis not present

## 2015-06-15 DIAGNOSIS — M67472 Ganglion, left ankle and foot: Secondary | ICD-10-CM | POA: Diagnosis not present

## 2015-06-17 ENCOUNTER — Telehealth: Payer: Self-pay | Admitting: *Deleted

## 2015-06-17 NOTE — Progress Notes (Signed)
DOS 06/15/2015 left foot cyst removal.

## 2015-06-17 NOTE — Telephone Encounter (Addendum)
Post op courtesy call-I spoke with pt's wife, Rod Holler and she states pt is doing great and he is at work, has stopped the pain medication due to little nausea and he said the foot only ached a little.  I spoke with pt, he states that he is doing well, I encouraged RICE therapy, not to have foot below the heart or weight bearing more than 15 minutes/hour, leave the original surgery dressing in place until seen by Dr. Jacqualyn Posey, and to call with concerns.

## 2015-06-24 ENCOUNTER — Ambulatory Visit (INDEPENDENT_AMBULATORY_CARE_PROVIDER_SITE_OTHER): Payer: Medicare HMO | Admitting: Podiatry

## 2015-06-24 ENCOUNTER — Encounter: Payer: Self-pay | Admitting: Podiatry

## 2015-06-24 VITALS — BP 144/78 | HR 62 | Resp 16

## 2015-06-24 DIAGNOSIS — M6740 Ganglion, unspecified site: Secondary | ICD-10-CM

## 2015-06-24 DIAGNOSIS — Z9889 Other specified postprocedural states: Secondary | ICD-10-CM

## 2015-06-27 ENCOUNTER — Encounter: Payer: Self-pay | Admitting: Podiatry

## 2015-06-27 NOTE — Progress Notes (Signed)
Patient ID: DELNO IRIAS, male   DOB: 07/28/1945, 70 y.o.   MRN: [id number]  Subjective: Kevin Ramirez is a 70 y.o. is seen today in office s/p left ganglion cyst excision . He states that he is doing well and is regular the surgical shoe. He has had no discomfort. He is completed antibiotics. Is not taking pain medicine.  Denies any systemic complaints such as fevers, chills, nausea, vomiting. No calf pain, chest pain, shortness of breath.   Objective: General: No acute distress, AAOx3  DP/PT pulses palpable 2/4, CRT < 3 sec to all digits.  Protective sensation intact. Motor function intact.  Left  foot: Incision is well coapted without any evidence of dehiscence and sutures intact. There is no surrounding erythema, ascending cellulitis, fluctuance, crepitus, malodor, drainage/purulence. Mild ecchymosis the digits. There is minimal edema around the surgical site. There is no pain along the surgical site.  No other areas of tenderness to bilateral lower extremities.  No other open lesions or pre-ulcerative lesions.  No pain with calf compression, swelling, warmth, erythema.   Assessment and Plan:  Status post left foot cyst excision, doing well with no complications   -Treatment options discussed including all alternatives, risks, and complications -Antibiotic ointment was applied over the incision followed by dry sterile dressing. Keep the dressing clean, dry, intact. -I discussed with him intraoperative findings in regards the cyst being around the tendon. Discussed possible recurrence. -Ice/elevation -Pain medication as needed. -Monitor for any clinical signs or symptoms of infection and DVT/PE and directed to call the office immediately should any occur or go to the ER. -Follow-up in 1 week for suture removal or sooner if any problems arise. In the meantime, encouraged to call the office with any questions, concerns, change in symptoms.   Celesta Gentile, DPM

## 2015-07-01 ENCOUNTER — Ambulatory Visit (INDEPENDENT_AMBULATORY_CARE_PROVIDER_SITE_OTHER): Payer: Medicare HMO | Admitting: Podiatry

## 2015-07-01 DIAGNOSIS — M6740 Ganglion, unspecified site: Secondary | ICD-10-CM

## 2015-07-01 DIAGNOSIS — Z9889 Other specified postprocedural states: Secondary | ICD-10-CM

## 2015-07-04 NOTE — Progress Notes (Signed)
Patient ID: Kevin Ramirez, male   DOB: [birth year], 70 y.o.   MRN: [id number]  Subjective: Kevin Ramirez is a 69 y.o. is seen today in office s/p left ganglion cyst excision. He states that he is doing well and has been wearing a regular shoe. He is an occasional discomfort but he is not taking any pain medicine. Denies any increase in swelling or redness or warmth of the foot. Denies any systemic complaints such as fevers, chills, nausea, vomiting. No calf pain, chest pain, shortness of breath.   Objective: General: No acute distress, AAOx3  DP/PT pulses palpable 2/4, CRT < 3 sec to all digits.  Protective sensation intact. Motor function intact.  Left  foot: Incision is well coapted without any evidence of dehiscence and sutures intact. There is no surrounding erythema, ascending cellulitis, fluctuance, crepitus, malodor, drainage/purulence. Mild ecchymosis the digits.but improving. There is decreased edema around the surgical site. There is no pain along the surgical site.  No other areas of tenderness to bilateral lower extremities.  No other open lesions or pre-ulcerative lesions.  No pain with calf compression, swelling, warmth, erythema.   Assessment and Plan:  Status post left foot cyst excision, doing well with no complications   -Treatment options discussed including all alternatives, risks, and complications -Sutures removed. Antibiotic ointment was applied over the incision. Can start to shower -Ice -Compression anklet.  -Monitor for any clinical signs or symptoms of infection and DVT/PE and directed to call the office immediately should any occur or go to the ER. -Follow-up in 4 weeks or sooner if any problems arise. In the meantime, encouraged to call the office with any questions, concerns, change in symptoms.   Celesta Gentile, DPM

## 2015-07-10 DIAGNOSIS — G4733 Obstructive sleep apnea (adult) (pediatric): Secondary | ICD-10-CM | POA: Diagnosis not present

## 2015-07-10 DIAGNOSIS — R400 Somnolence: Secondary | ICD-10-CM | POA: Diagnosis not present

## 2015-07-25 DIAGNOSIS — H3581 Retinal edema: Secondary | ICD-10-CM | POA: Diagnosis not present

## 2015-07-25 DIAGNOSIS — H35373 Puckering of macula, bilateral: Secondary | ICD-10-CM | POA: Diagnosis not present

## 2015-07-25 DIAGNOSIS — H43811 Vitreous degeneration, right eye: Secondary | ICD-10-CM | POA: Diagnosis not present

## 2015-07-25 DIAGNOSIS — H43822 Vitreomacular adhesion, left eye: Secondary | ICD-10-CM | POA: Diagnosis not present

## 2015-07-27 DIAGNOSIS — R69 Illness, unspecified: Secondary | ICD-10-CM | POA: Diagnosis not present

## 2015-08-01 ENCOUNTER — Encounter: Payer: Medicare HMO | Admitting: Podiatry

## 2015-08-02 ENCOUNTER — Encounter: Payer: Self-pay | Admitting: Podiatry

## 2015-08-10 DIAGNOSIS — G4733 Obstructive sleep apnea (adult) (pediatric): Secondary | ICD-10-CM | POA: Diagnosis not present

## 2015-08-10 DIAGNOSIS — R400 Somnolence: Secondary | ICD-10-CM | POA: Diagnosis not present

## 2015-08-25 DIAGNOSIS — H35371 Puckering of macula, right eye: Secondary | ICD-10-CM | POA: Diagnosis not present

## 2015-09-02 DIAGNOSIS — Z48810 Encounter for surgical aftercare following surgery on the sense organs: Secondary | ICD-10-CM | POA: Diagnosis not present

## 2015-09-02 DIAGNOSIS — H35371 Puckering of macula, right eye: Secondary | ICD-10-CM | POA: Diagnosis not present

## 2015-09-09 DIAGNOSIS — G4733 Obstructive sleep apnea (adult) (pediatric): Secondary | ICD-10-CM | POA: Diagnosis not present

## 2015-09-09 DIAGNOSIS — R400 Somnolence: Secondary | ICD-10-CM | POA: Diagnosis not present

## 2015-09-28 DIAGNOSIS — H3581 Retinal edema: Secondary | ICD-10-CM | POA: Diagnosis not present

## 2015-09-28 DIAGNOSIS — H35371 Puckering of macula, right eye: Secondary | ICD-10-CM | POA: Diagnosis not present

## 2015-09-29 DIAGNOSIS — R400 Somnolence: Secondary | ICD-10-CM | POA: Diagnosis not present

## 2015-09-29 DIAGNOSIS — G4733 Obstructive sleep apnea (adult) (pediatric): Secondary | ICD-10-CM | POA: Diagnosis not present

## 2015-10-04 DIAGNOSIS — D1801 Hemangioma of skin and subcutaneous tissue: Secondary | ICD-10-CM | POA: Diagnosis not present

## 2015-10-04 DIAGNOSIS — L821 Other seborrheic keratosis: Secondary | ICD-10-CM | POA: Diagnosis not present

## 2015-10-04 DIAGNOSIS — Z85828 Personal history of other malignant neoplasm of skin: Secondary | ICD-10-CM | POA: Diagnosis not present

## 2015-10-04 DIAGNOSIS — L820 Inflamed seborrheic keratosis: Secondary | ICD-10-CM | POA: Diagnosis not present

## 2015-10-10 DIAGNOSIS — G4733 Obstructive sleep apnea (adult) (pediatric): Secondary | ICD-10-CM | POA: Diagnosis not present

## 2015-10-10 DIAGNOSIS — R400 Somnolence: Secondary | ICD-10-CM | POA: Diagnosis not present

## 2015-11-10 DIAGNOSIS — R400 Somnolence: Secondary | ICD-10-CM | POA: Diagnosis not present

## 2015-11-10 DIAGNOSIS — G4733 Obstructive sleep apnea (adult) (pediatric): Secondary | ICD-10-CM | POA: Diagnosis not present

## 2015-12-05 DIAGNOSIS — M25562 Pain in left knee: Secondary | ICD-10-CM | POA: Diagnosis not present

## 2015-12-10 DIAGNOSIS — R400 Somnolence: Secondary | ICD-10-CM | POA: Diagnosis not present

## 2015-12-10 DIAGNOSIS — G4733 Obstructive sleep apnea (adult) (pediatric): Secondary | ICD-10-CM | POA: Diagnosis not present

## 2015-12-12 DIAGNOSIS — M25562 Pain in left knee: Secondary | ICD-10-CM | POA: Diagnosis not present

## 2015-12-19 DIAGNOSIS — M25562 Pain in left knee: Secondary | ICD-10-CM | POA: Diagnosis not present

## 2015-12-21 DIAGNOSIS — Z23 Encounter for immunization: Secondary | ICD-10-CM | POA: Diagnosis not present

## 2015-12-21 DIAGNOSIS — Z1389 Encounter for screening for other disorder: Secondary | ICD-10-CM | POA: Diagnosis not present

## 2015-12-21 DIAGNOSIS — M25562 Pain in left knee: Secondary | ICD-10-CM | POA: Diagnosis not present

## 2015-12-21 DIAGNOSIS — Z0000 Encounter for general adult medical examination without abnormal findings: Secondary | ICD-10-CM | POA: Diagnosis not present

## 2015-12-21 DIAGNOSIS — N529 Male erectile dysfunction, unspecified: Secondary | ICD-10-CM | POA: Diagnosis not present

## 2015-12-21 DIAGNOSIS — I10 Essential (primary) hypertension: Secondary | ICD-10-CM | POA: Diagnosis not present

## 2015-12-21 DIAGNOSIS — M160 Bilateral primary osteoarthritis of hip: Secondary | ICD-10-CM | POA: Diagnosis not present

## 2015-12-21 DIAGNOSIS — E7800 Pure hypercholesterolemia, unspecified: Secondary | ICD-10-CM | POA: Diagnosis not present

## 2015-12-26 DIAGNOSIS — M25562 Pain in left knee: Secondary | ICD-10-CM | POA: Diagnosis not present

## 2015-12-27 DIAGNOSIS — Z0000 Encounter for general adult medical examination without abnormal findings: Secondary | ICD-10-CM | POA: Diagnosis not present

## 2015-12-27 DIAGNOSIS — I10 Essential (primary) hypertension: Secondary | ICD-10-CM | POA: Diagnosis not present

## 2015-12-27 DIAGNOSIS — Z125 Encounter for screening for malignant neoplasm of prostate: Secondary | ICD-10-CM | POA: Diagnosis not present

## 2015-12-27 DIAGNOSIS — E7800 Pure hypercholesterolemia, unspecified: Secondary | ICD-10-CM | POA: Diagnosis not present

## 2016-01-26 DIAGNOSIS — M1712 Unilateral primary osteoarthritis, left knee: Secondary | ICD-10-CM | POA: Diagnosis not present

## 2016-01-31 DIAGNOSIS — M11262 Other chondrocalcinosis, left knee: Secondary | ICD-10-CM | POA: Diagnosis not present

## 2016-01-31 DIAGNOSIS — M21162 Varus deformity, not elsewhere classified, left knee: Secondary | ICD-10-CM | POA: Diagnosis not present

## 2016-01-31 DIAGNOSIS — M1712 Unilateral primary osteoarthritis, left knee: Secondary | ICD-10-CM | POA: Diagnosis not present

## 2016-01-31 DIAGNOSIS — M67462 Ganglion, left knee: Secondary | ICD-10-CM | POA: Diagnosis not present

## 2016-01-31 DIAGNOSIS — M25562 Pain in left knee: Secondary | ICD-10-CM | POA: Diagnosis not present

## 2016-02-09 ENCOUNTER — Other Ambulatory Visit: Payer: Self-pay | Admitting: Orthopedic Surgery

## 2016-02-24 ENCOUNTER — Encounter (HOSPITAL_COMMUNITY)
Admission: RE | Admit: 2016-02-24 | Discharge: 2016-02-24 | Disposition: A | Discharge status: Home / Self Care | Payer: Medicare HMO | Source: Ambulatory Visit | Attending: Orthopedic Surgery | Admitting: Orthopedic Surgery

## 2016-02-24 ENCOUNTER — Encounter (HOSPITAL_COMMUNITY): Payer: Self-pay

## 2016-02-24 HISTORY — DX: Sleep apnea, unspecified: G47.30

## 2016-02-24 LAB — COMPREHENSIVE METABOLIC PANEL
ALT: 24 U/L (ref 17–63)
ANION GAP: 12 (ref 5–15)
AST: 26 U/L (ref 15–41)
Albumin: 4.4 g/dL (ref 3.5–5.0)
Alkaline Phosphatase: 43 U/L (ref 38–126)
BILIRUBIN TOTAL: 0.7 mg/dL (ref 0.3–1.2)
BUN: 16 mg/dL (ref 6–20)
CO2: 24 mmol/L (ref 22–32)
Calcium: 9.8 mg/dL (ref 8.9–10.3)
Chloride: 103 mmol/L (ref 101–111)
Creatinine, Ser: 1.05 mg/dL (ref 0.61–1.24)
Glucose, Bld: 92 mg/dL (ref 65–99)
Potassium: 4.1 mmol/L (ref 3.5–5.1)
Sodium: 139 mmol/L (ref 135–145)
TOTAL PROTEIN: 6.7 g/dL (ref 6.5–8.1)

## 2016-02-24 LAB — CBC WITH DIFFERENTIAL/PLATELET
Basophils Absolute: 0 10*3/uL (ref 0.0–0.1)
Basophils Relative: 0 %
EOS PCT: 5 %
Eosinophils Absolute: 0.3 10*3/uL (ref 0.0–0.7)
HEMATOCRIT: 45.2 % (ref 39.0–52.0)
Hemoglobin: 15.5 g/dL (ref 13.0–17.0)
LYMPHS PCT: 39 %
Lymphs Abs: 2.5 10*3/uL (ref 0.7–4.0)
MCH: 30.3 pg (ref 26.0–34.0)
MCHC: 34.3 g/dL (ref 30.0–36.0)
MCV: 88.3 fL (ref 78.0–100.0)
MONO ABS: 0.5 10*3/uL (ref 0.1–1.0)
MONOS PCT: 7 %
NEUTROS ABS: 3 10*3/uL (ref 1.7–7.7)
Neutrophils Relative %: 49 %
Platelets: 220 10*3/uL (ref 150–400)
RBC: 5.12 MIL/uL (ref 4.22–5.81)
RDW: 12.7 % (ref 11.5–15.5)
WBC: 6.3 10*3/uL (ref 4.0–10.5)

## 2016-02-24 LAB — SURGICAL PCR SCREEN
MRSA, PCR: NEGATIVE
Staphylococcus aureus: NEGATIVE

## 2016-02-24 NOTE — Pre-Procedure Instructions (Addendum)
Kevin Ramirez  02/24/2016      [id number] - [address] [place] [address] Phone: [phone] Fax: [place] [address] [place] - [id number] [place] [place] [address] Phone: [phone] Fax: [phone]    Your procedure is scheduled on 02/27/16.  Report to [facility] Admitting at 530 A.M.  Call this number if you have problems the morning of surgery:  [phone]   Remember:  Do not eat food or drink liquids after midnight.  Take these medicines the morning of surgery with A SIP OF WATER   --  NONE  STOP all herbel meds, nsaids (aleve,naproxen,advil,ibuprofen) starting TODAY including aspirin, all vitamins   Do not wear jewelry, make-up or nail polish.  Do not wear lotions, powders, or perfumes, or deoderant.  Do not shave 48 hours prior to surgery.  Men may shave face and neck.  Do not bring valuables to the hospital.  [facility] is not responsible for any belongings or valuables.  Contacts, dentures or bridgework may not be worn into surgery.  Leave your suitcase in the car.  After surgery it may be brought to your room.  For patients admitted to the hospital, discharge time will be determined by your treatment team.  Patients discharged the day of surgery will not be allowed to drive home.  ** Special instructions:   Special Instructions: [facility] - Preparing for Surgery  Before surgery, you can play an important role.  Because skin is not sterile, your skin needs to be as free of germs as possible.  You can reduce the number of germs on you skin by washing with CHG (chlorahexidine gluconate) soap before surgery.  CHG is an antiseptic cleaner which kills germs and bonds with the skin to continue killing germs even after washing.  Please DO NOT use if you have an allergy to CHG or antibacterial  soaps.  If your skin becomes reddened/irritated stop using the CHG and inform your nurse when you arrive at Short Stay.  Do not shave (including legs and underarms) for at least 48 hours prior to the first CHG shower.  You may shave your face.  Please follow these instructions carefully:   1.  Shower with CHG Soap the night before surgery and the morning of Surgery.  2.  If you choose to wash your hair, wash your hair first as usual with your normal shampoo.  3.  After you shampoo, rinse your hair and body thoroughly to remove the Shampoo.  4.  Use CHG as you would any other liquid soap.  You can apply chg directly  to the skin and wash gently with scrungie or a clean washcloth.  5.  Apply the CHG Soap to your body ONLY FROM THE NECK DOWN.  Do not use on open wounds or open sores.  Avoid contact with your eyes ears, mouth and genitals (private parts).  Wash genitals (private parts)       with your normal soap.  6.  Wash thoroughly, paying special attention to the area where your surgery will be performed.  7.  Thoroughly rinse your body with warm water from the neck down.  8.  DO NOT shower/wash with your normal soap after using and rinsing off the CHG Soap.  9.  Pat yourself dry with a clean towel.  10.  Wear clean pajamas.            11.  Place clean sheets on your bed the night of your first shower and do not sleep with pets.  Day of Surgery  Do not apply any lotions/deodorants the morning of surgery.  Please wear clean clothes to the hospital/surgery center.  Please read over the  fact sheets that you were given.

## 2016-02-26 ENCOUNTER — Encounter (HOSPITAL_COMMUNITY): Payer: Self-pay | Admitting: Anesthesiology

## 2016-02-26 MED ORDER — ACETAMINOPHEN 500 MG PO TABS
1000.0000 mg | ORAL_TABLET | Freq: Once | ORAL | Status: AC
  Administered 2016-02-27: 1000 mg via ORAL
  Filled 2016-02-26: qty 2

## 2016-02-26 MED ORDER — SODIUM CHLORIDE 0.9 % IV SOLN
1000.0000 mg | INTRAVENOUS | Status: AC
  Administered 2016-02-27: 1000 mg via INTRAVENOUS
  Filled 2016-02-26: qty 10

## 2016-02-26 MED ORDER — CEFAZOLIN SODIUM-DEXTROSE 2-4 GM/100ML-% IV SOLN
2.0000 g | INTRAVENOUS | Status: AC
  Administered 2016-02-27: 2 g via INTRAVENOUS
  Filled 2016-02-26: qty 100

## 2016-02-26 MED ORDER — GABAPENTIN 300 MG PO CAPS
300.0000 mg | ORAL_CAPSULE | ORAL | Status: AC
  Administered 2016-02-27: 300 mg via ORAL
  Filled 2016-02-26: qty 1

## 2016-02-26 MED ORDER — DEXAMETHASONE SODIUM PHOSPHATE 10 MG/ML IJ SOLN
8.0000 mg | Freq: Once | INTRAMUSCULAR | Status: AC
  Administered 2016-02-27: 10 mg via INTRAVENOUS
  Filled 2016-02-26: qty 1

## 2016-02-26 MED ORDER — SODIUM CHLORIDE 0.9 % IV SOLN: INTRAVENOUS | Status: DC

## 2016-02-26 MED ORDER — BUPIVACAINE LIPOSOME 1.3 % IJ SUSP
20.0000 mL | INTRAMUSCULAR | Status: AC
  Administered 2016-02-27: 20 mL
  Filled 2016-02-26: qty 20

## 2016-02-26 NOTE — Anesthesia Preprocedure Evaluation (Addendum)
Anesthesia Evaluation  Patient identified by MRN, date of birth, ID band Patient awake    Reviewed: Allergy & Precautions, NPO status , Patient's Chart, lab work & pertinent test results  Airway Mallampati: II  TM Distance: >3 FB Neck ROM: Full    Dental  (+) Caps   Pulmonary sleep apnea and Continuous Positive Airway Pressure Ventilation ,    Pulmonary exam normal breath sounds clear to auscultation       Cardiovascular hypertension, Pt. on medications Normal cardiovascular exam Rhythm:Regular Rate:Normal     Neuro/Psych negative neurological ROS  negative psych ROS   GI/Hepatic negative GI ROS, Neg liver ROS,   Endo/Other  Hyperlipidemia  Renal/GU negative Renal ROS  negative genitourinary   Musculoskeletal  (+) Arthritis , Osteoarthritis,  OA left knee Scoliosis Lumbar spine   Abdominal   Peds  Hematology negative hematology ROS (+)   Anesthesia Other Findings   Reproductive/Obstetrics                             Chemistry      Component Value Date/Time   NA 139 02/24/2016 1144   K 4.1 02/24/2016 1144   CL 103 02/24/2016 1144   CO2 24 02/24/2016 1144   BUN 16 02/24/2016 1144   CREATININE 1.05 02/24/2016 1144   CREATININE 1.10 10/14/2014 0732      Component Value Date/Time   CALCIUM 9.8 02/24/2016 1144   ALKPHOS 43 02/24/2016 1144   AST 26 02/24/2016 1144   ALT 24 02/24/2016 1144   BILITOT 0.7 02/24/2016 1144     Lab Results  Component Value Date   WBC 6.3 02/24/2016   HGB 15.5 02/24/2016   HCT 45.2 02/24/2016   MCV 88.3 02/24/2016   PLT 220 02/24/2016   EKG:  Anesthesia Physical Anesthesia Plan  ASA: II  Anesthesia Plan: Spinal and Regional   Post-op Pain Management:  Regional for Post-op pain   Induction: Intravenous  Airway Management Planned: Natural Airway, Nasal Cannula and Simple Face Mask  Additional Equipment:   Intra-op Plan:   Post-operative  Plan: Extubation in OR  Informed Consent: I have reviewed the patients History and Physical, chart, labs and discussed the procedure including the risks, benefits and alternatives for the proposed anesthesia with the patient or authorized representative who has indicated his/her understanding and acceptance.   Dental advisory given  Plan Discussed with: CRNA, Anesthesiologist and Surgeon  Anesthesia Plan Comments:        Anesthesia Quick Evaluation

## 2016-02-26 NOTE — H&P (Signed)
RODDRICK RUTIGLIANO MRN:  [id number] DOB/SEX:  [birth year]/male  CHIEF COMPLAINT:  Painful left Knee  HISTORY: Patient is a 71 y.o. male presented with a history of pain in the left knee. Onset of symptoms was gradual starting a few years ago with gradually worsening course since that time. Patient has been treated conservatively with over-the-counter NSAIDs and activity modification. Patient currently rates pain in the knee at 10 out of 10 with activity. There is pain at night.  PAST MEDICAL HISTORY: Patient Active Problem List   Diagnosis Date Noted  . Ganglion cyst 05/30/2015   Past Medical History:  Diagnosis Date  . Arthritis   . Hypertension   . Sleep apnea    cpap   Past Surgical History:  Procedure Laterality Date  . arthroscopic surgery Left knee yrs ago   x 2  . COLONOSCOPY WITH PROPOFOL N/A 07/28/2013   Procedure: COLONOSCOPY WITH PROPOFOL;  Surgeon: Charolett Bumpers, MD;  Location: [facility];  Service: Endoscopy;  Laterality: N/A;  . left knee     arothroscopy  . surgery to chest  age 7   to bring chest wall in -"pigeon breasted"     MEDICATIONS:   No prescriptions prior to admission.    ALLERGIES:   Allergies  Allergen Reactions  . No Known Allergies     REVIEW OF SYSTEMS:  A comprehensive review of systems was negative except for: Musculoskeletal: positive for arthralgias and bone pain   FAMILY HISTORY:  History reviewed. No pertinent family history.  SOCIAL HISTORY:   Social History  Substance Use Topics  . Smoking status: Never Smoker  . Smokeless tobacco: Never Used  . Alcohol use Yes     Comment: occasional     EXAMINATION:  Vital signs in last 24 hours:    There were no vitals taken for this visit.  General Appearance:    Alert, cooperative, no distress, appears stated age  Head:    Normocephalic, without obvious abnormality, atraumatic  Eyes:    PERRL, conjunctiva/corneas clear, EOM's intact, fundi    benign, both eyes       Ears:     Normal TM's and external ear canals, both ears  Nose:   Nares normal, septum midline, mucosa normal, no drainage    or sinus tenderness  Throat:   Lips, mucosa, and tongue normal; teeth and gums normal  Neck:   Supple, symmetrical, trachea midline, no adenopathy;       thyroid:  No enlargement/tenderness/nodules; no carotid   bruit or JVD  Back:     Symmetric, no curvature, ROM normal, no CVA tenderness  Lungs:     Clear to auscultation bilaterally, respirations unlabored  Chest wall:    No tenderness or deformity  Heart:    Regular rate and rhythm, S1 and S2 normal, no murmur, rub   or gallop  Abdomen:     Soft, non-tender, bowel sounds active all four quadrants,    no masses, no organomegaly  Genitalia:    Normal male without lesion, discharge or tenderness  Rectal:    Normal tone, normal prostate, no masses or tenderness;   guaiac negative stool  Extremities:   Extremities normal, atraumatic, no cyanosis or edema  Pulses:   2+ and symmetric all extremities  Skin:   Skin color, texture, turgor normal, no rashes or lesions  Lymph nodes:   Cervical, supraclavicular, and axillary nodes normal  Neurologic:   CNII-XII intact. Normal strength, sensation and reflexes  throughout     Musculoskeletal:  ROM 0-120, Ligaments intact,  Imaging Review Plain radiographs demonstrate severe degenerative joint disease of the left knee. The overall alignment is neutral. The bone quality appears to be excellent for age and reported activity level.  Assessment/Plan: Primary osteoarthritis, left knee   The patient history, physical examination and imaging studies are consistent with advanced degenerative joint disease of the left knee. The patient has failed conservative treatment.  The clearance notes were reviewed.  After discussion with the patient it was felt that Total Knee Replacement was indicated. The procedure,  risks, and benefits of total knee arthroplasty were presented and reviewed. The  risks including but not limited to aseptic loosening, infection, blood clots, vascular injury, stiffness, patella tracking problems complications among others were discussed. The patient acknowledged the explanation, agreed to proceed with the plan. Guy Sandifer 02/26/2016, 9:58 PM

## 2016-02-27 ENCOUNTER — Inpatient Hospital Stay (HOSPITAL_COMMUNITY): Payer: Medicare HMO | Admitting: Anesthesiology

## 2016-02-27 ENCOUNTER — Encounter (HOSPITAL_COMMUNITY): Payer: Self-pay | Admitting: *Deleted

## 2016-02-27 ENCOUNTER — Encounter (HOSPITAL_COMMUNITY)
Admission: RE | Disposition: A | Discharge status: Home Health | Payer: Self-pay | Source: Ambulatory Visit | Attending: Orthopedic Surgery

## 2016-02-27 ENCOUNTER — Inpatient Hospital Stay (HOSPITAL_COMMUNITY)
Admission: RE | Admit: 2016-02-27 | Discharge: 2016-02-28 | DRG: 470 | Disposition: A | Discharge status: Home Health | Payer: Medicare HMO | Source: Ambulatory Visit | Attending: Orthopedic Surgery | Admitting: Orthopedic Surgery

## 2016-02-27 DIAGNOSIS — G8918 Other acute postprocedural pain: Secondary | ICD-10-CM | POA: Diagnosis not present

## 2016-02-27 DIAGNOSIS — G4730 Sleep apnea, unspecified: Secondary | ICD-10-CM | POA: Diagnosis present

## 2016-02-27 DIAGNOSIS — I10 Essential (primary) hypertension: Secondary | ICD-10-CM | POA: Diagnosis present

## 2016-02-27 DIAGNOSIS — Z96659 Presence of unspecified artificial knee joint: Secondary | ICD-10-CM

## 2016-02-27 DIAGNOSIS — M1712 Unilateral primary osteoarthritis, left knee: Secondary | ICD-10-CM | POA: Diagnosis not present

## 2016-02-27 DIAGNOSIS — M6748 Ganglion, other site: Secondary | ICD-10-CM | POA: Diagnosis not present

## 2016-02-27 DIAGNOSIS — M419 Scoliosis, unspecified: Secondary | ICD-10-CM | POA: Diagnosis present

## 2016-02-27 DIAGNOSIS — E785 Hyperlipidemia, unspecified: Secondary | ICD-10-CM | POA: Diagnosis not present

## 2016-02-27 DIAGNOSIS — Z96652 Presence of left artificial knee joint: Secondary | ICD-10-CM

## 2016-02-27 HISTORY — PX: TOTAL KNEE ARTHROPLASTY: SHX125

## 2016-02-27 SURGERY — ARTHROPLASTY, KNEE, TOTAL
Anesthesia: Regional | Site: Knee | Laterality: Left

## 2016-02-27 MED ORDER — DOCUSATE SODIUM 100 MG PO CAPS
100.0000 mg | ORAL_CAPSULE | Freq: Two times a day (BID) | ORAL | Status: DC
  Administered 2016-02-27 – 2016-02-28 (×2): 100 mg via ORAL
  Filled 2016-02-27 (×2): qty 1

## 2016-02-27 MED ORDER — LACTATED RINGERS IV SOLN
INTRAVENOUS | Status: DC | PRN
  Administered 2016-02-27 (×2): via INTRAVENOUS

## 2016-02-27 MED ORDER — PHENOL 1.4 % MT LIQD: 1.0000 | OROMUCOSAL | Status: DC | PRN

## 2016-02-27 MED ORDER — PROPOFOL 10 MG/ML IV BOLUS
INTRAVENOUS | Status: AC
  Filled 2016-02-27: qty 20

## 2016-02-27 MED ORDER — BISACODYL 5 MG PO TBEC: 5.0000 mg | DELAYED_RELEASE_TABLET | Freq: Every day | ORAL | Status: DC | PRN

## 2016-02-27 MED ORDER — MIDAZOLAM HCL 2 MG/2ML IJ SOLN
INTRAMUSCULAR | Status: AC
  Filled 2016-02-27: qty 2

## 2016-02-27 MED ORDER — MIDAZOLAM HCL 5 MG/5ML IJ SOLN
INTRAMUSCULAR | Status: DC | PRN
  Administered 2016-02-27: 2 mg via INTRAVENOUS

## 2016-02-27 MED ORDER — SODIUM CHLORIDE 0.9 % IR SOLN
Status: DC | PRN
  Administered 2016-02-27: 3000 mL

## 2016-02-27 MED ORDER — BUPIVACAINE HCL (PF) 0.5 % IJ SOLN
INTRAMUSCULAR | Status: AC
  Filled 2016-02-27: qty 30

## 2016-02-27 MED ORDER — ALUM & MAG HYDROXIDE-SIMETH 200-200-20 MG/5ML PO SUSP: 30.0000 mL | ORAL | Status: DC | PRN

## 2016-02-27 MED ORDER — HYDROMORPHONE HCL 1 MG/ML IJ SOLN: 0.2500 mg | INTRAMUSCULAR | Status: DC | PRN

## 2016-02-27 MED ORDER — SODIUM CHLORIDE 0.9 % IV SOLN
INTRAVENOUS | Status: DC
  Administered 2016-02-27: 22:00:00 via INTRAVENOUS

## 2016-02-27 MED ORDER — DIPHENHYDRAMINE HCL 12.5 MG/5ML PO ELIX
12.5000 mg | ORAL_SOLUTION | ORAL | Status: DC | PRN
Start: 2016-02-27 — End: 2016-02-28

## 2016-02-27 MED ORDER — GABAPENTIN 300 MG PO CAPS
300.0000 mg | ORAL_CAPSULE | Freq: Three times a day (TID) | ORAL | Status: DC
  Administered 2016-02-27 – 2016-02-28 (×3): 300 mg via ORAL
  Filled 2016-02-27 (×3): qty 1

## 2016-02-27 MED ORDER — FLEET ENEMA 7-19 GM/118ML RE ENEM: 1.0000 | ENEMA | Freq: Once | RECTAL | Status: DC | PRN

## 2016-02-27 MED ORDER — EPHEDRINE SULFATE 50 MG/ML IJ SOLN
INTRAMUSCULAR | Status: DC | PRN
  Administered 2016-02-27 (×3): 5 mg via INTRAVENOUS

## 2016-02-27 MED ORDER — TRANEXAMIC ACID 1000 MG/10ML IV SOLN
1000.0000 mg | Freq: Once | INTRAVENOUS | Status: AC
  Administered 2016-02-27: 1000 mg via INTRAVENOUS
  Filled 2016-02-27: qty 10

## 2016-02-27 MED ORDER — ACETAMINOPHEN 650 MG RE SUPP: 650.0000 mg | Freq: Four times a day (QID) | RECTAL | Status: DC | PRN

## 2016-02-27 MED ORDER — SIMVASTATIN 20 MG PO TABS
20.0000 mg | ORAL_TABLET | Freq: Every day | ORAL | Status: DC
  Administered 2016-02-27: 20 mg via ORAL
  Filled 2016-02-27: qty 1

## 2016-02-27 MED ORDER — ACETAMINOPHEN 325 MG PO TABS: 650.0000 mg | ORAL_TABLET | Freq: Four times a day (QID) | ORAL | Status: DC | PRN

## 2016-02-27 MED ORDER — ONDANSETRON HCL 4 MG/2ML IJ SOLN: 4.0000 mg | Freq: Four times a day (QID) | INTRAMUSCULAR | Status: DC | PRN

## 2016-02-27 MED ORDER — BUPIVACAINE HCL (PF) 0.25 % IJ SOLN
INTRAMUSCULAR | Status: AC
  Filled 2016-02-27: qty 30

## 2016-02-27 MED ORDER — ZOLPIDEM TARTRATE 5 MG PO TABS: 5.0000 mg | ORAL_TABLET | Freq: Every evening | ORAL | Status: DC | PRN

## 2016-02-27 MED ORDER — CEFAZOLIN IN D5W 1 GM/50ML IV SOLN
1.0000 g | Freq: Four times a day (QID) | INTRAVENOUS | Status: AC
  Administered 2016-02-27 – 2016-02-28 (×2): 1 g via INTRAVENOUS
  Filled 2016-02-27 (×3): qty 50

## 2016-02-27 MED ORDER — CHLORHEXIDINE GLUCONATE 4 % EX LIQD: 60.0000 mL | Freq: Once | CUTANEOUS | Status: DC

## 2016-02-27 MED ORDER — ASPIRIN EC 325 MG PO TBEC
325.0000 mg | DELAYED_RELEASE_TABLET | Freq: Two times a day (BID) | ORAL | Status: DC
  Administered 2016-02-27 – 2016-02-28 (×2): 325 mg via ORAL
  Filled 2016-02-27 (×2): qty 1

## 2016-02-27 MED ORDER — DEXTROSE 5 % IV SOLN
500.0000 mg | Freq: Four times a day (QID) | INTRAVENOUS | Status: DC | PRN
  Filled 2016-02-27: qty 5

## 2016-02-27 MED ORDER — FENTANYL CITRATE (PF) 100 MCG/2ML IJ SOLN
INTRAMUSCULAR | Status: AC
  Filled 2016-02-27: qty 2

## 2016-02-27 MED ORDER — 0.9 % SODIUM CHLORIDE (POUR BTL) OPTIME
TOPICAL | Status: DC | PRN
  Administered 2016-02-27: 1000 mL

## 2016-02-27 MED ORDER — METOCLOPRAMIDE HCL 5 MG PO TABS: 5.0000 mg | ORAL_TABLET | Freq: Three times a day (TID) | ORAL | Status: DC | PRN

## 2016-02-27 MED ORDER — MEPERIDINE HCL 25 MG/ML IJ SOLN: 6.2500 mg | INTRAMUSCULAR | Status: DC | PRN

## 2016-02-27 MED ORDER — SENNOSIDES-DOCUSATE SODIUM 8.6-50 MG PO TABS: 1.0000 | ORAL_TABLET | Freq: Every evening | ORAL | Status: DC | PRN

## 2016-02-27 MED ORDER — FENTANYL CITRATE (PF) 100 MCG/2ML IJ SOLN
INTRAMUSCULAR | Status: DC | PRN
  Administered 2016-02-27: 100 ug via INTRAVENOUS

## 2016-02-27 MED ORDER — ONDANSETRON HCL 4 MG PO TABS: 4.0000 mg | ORAL_TABLET | Freq: Four times a day (QID) | ORAL | Status: DC | PRN

## 2016-02-27 MED ORDER — ONDANSETRON HCL 4 MG/2ML IJ SOLN: 4.0000 mg | Freq: Once | INTRAMUSCULAR | Status: DC | PRN

## 2016-02-27 MED ORDER — OXYCODONE HCL 5 MG PO TABS
5.0000 mg | ORAL_TABLET | ORAL | Status: DC | PRN
  Administered 2016-02-27 – 2016-02-28 (×3): 10 mg via ORAL
  Filled 2016-02-27 (×3): qty 2

## 2016-02-27 MED ORDER — METHOCARBAMOL 500 MG PO TABS
500.0000 mg | ORAL_TABLET | Freq: Four times a day (QID) | ORAL | Status: DC | PRN
  Administered 2016-02-27 – 2016-02-28 (×2): 500 mg via ORAL
  Filled 2016-02-27 (×2): qty 1

## 2016-02-27 MED ORDER — METOCLOPRAMIDE HCL 5 MG/ML IJ SOLN: 5.0000 mg | Freq: Three times a day (TID) | INTRAMUSCULAR | Status: DC | PRN

## 2016-02-27 MED ORDER — SODIUM CHLORIDE 0.9 % IJ SOLN
INTRAMUSCULAR | Status: DC | PRN
  Administered 2016-02-27: 20 mL

## 2016-02-27 MED ORDER — BUPIVACAINE IN DEXTROSE 0.75-8.25 % IT SOLN
INTRATHECAL | Status: DC | PRN
  Administered 2016-02-27: 2 mL via INTRATHECAL

## 2016-02-27 MED ORDER — MENTHOL 3 MG MT LOZG: 1.0000 | LOZENGE | OROMUCOSAL | Status: DC | PRN

## 2016-02-27 MED ORDER — HYDROMORPHONE HCL 1 MG/ML IJ SOLN: 1.0000 mg | INTRAMUSCULAR | Status: DC | PRN

## 2016-02-27 MED ORDER — PROPOFOL 500 MG/50ML IV EMUL
INTRAVENOUS | Status: DC | PRN
  Administered 2016-02-27: 50 ug/kg/min via INTRAVENOUS

## 2016-02-27 MED ORDER — HYDROCHLOROTHIAZIDE 25 MG PO TABS
25.0000 mg | ORAL_TABLET | Freq: Every day | ORAL | Status: DC
  Administered 2016-02-28: 25 mg via ORAL
  Filled 2016-02-27: qty 1

## 2016-02-27 MED ORDER — HYDROMORPHONE HCL 2 MG/ML IJ SOLN: 1.0000 mg | INTRAMUSCULAR | Status: DC | PRN

## 2016-02-27 MED ORDER — BUPIVACAINE HCL 0.25 % IJ SOLN
INTRAMUSCULAR | Status: DC | PRN
  Administered 2016-02-27: 30 mL

## 2016-02-27 MED ORDER — CELECOXIB 200 MG PO CAPS
200.0000 mg | ORAL_CAPSULE | Freq: Two times a day (BID) | ORAL | Status: DC
  Administered 2016-02-27 – 2016-02-28 (×2): 200 mg via ORAL
  Filled 2016-02-27 (×2): qty 1

## 2016-02-27 MED ORDER — DEXAMETHASONE SODIUM PHOSPHATE 10 MG/ML IJ SOLN
10.0000 mg | Freq: Once | INTRAMUSCULAR | Status: AC
  Administered 2016-02-28: 10 mg via INTRAVENOUS
  Filled 2016-02-27: qty 1

## 2016-02-27 MED ORDER — OXYCODONE HCL ER 10 MG PO T12A
10.0000 mg | EXTENDED_RELEASE_TABLET | Freq: Two times a day (BID) | ORAL | Status: DC
  Administered 2016-02-27 – 2016-02-28 (×2): 10 mg via ORAL
  Filled 2016-02-27 (×2): qty 1

## 2016-02-27 SURGICAL SUPPLY — 58 items
BANDAGE ACE 6X5 VEL STRL LF (GAUZE/BANDAGES/DRESSINGS) ×2 IMPLANT
BANDAGE ESMARK 6X9 LF (GAUZE/BANDAGES/DRESSINGS) ×1 IMPLANT
BLADE SAG 18X100X1.27 (BLADE) IMPLANT
BLADE SAGITTAL 13X1.27X60 (BLADE) ×2 IMPLANT
BLADE SAW SGTL 18X1.27X75 (BLADE) ×2 IMPLANT
BLADE SAW SGTL 83.5X18.5 (BLADE) ×2 IMPLANT
BLADE SURG 10 STRL SS (BLADE) ×2 IMPLANT
BNDG ESMARK 6X9 LF (GAUZE/BANDAGES/DRESSINGS) ×2
BOWL SMART MIX CTS (DISPOSABLE) ×2 IMPLANT
CAPT KNEE TOTAL 3 ×2 IMPLANT
CEMENT BONE SIMPLEX SPEEDSET (Cement) ×4 IMPLANT
CLSR STERI-STRIP ANTIMIC 1/2X4 (GAUZE/BANDAGES/DRESSINGS) ×2 IMPLANT
COVER SURGICAL LIGHT HANDLE (MISCELLANEOUS) ×2 IMPLANT
CUFF TOURNIQUET SINGLE 34IN LL (TOURNIQUET CUFF) ×2 IMPLANT
DRAPE EXTREMITY T 121X128X90 (DRAPE) ×2 IMPLANT
DRAPE HALF SHEET 40X57 (DRAPES) ×2 IMPLANT
DRAPE INCISE IOBAN 66X45 STRL (DRAPES) ×4 IMPLANT
DRAPE U-SHAPE 47X51 STRL (DRAPES) ×2 IMPLANT
DRSG AQUACEL AG ADV 3.5X10 (GAUZE/BANDAGES/DRESSINGS) ×2 IMPLANT
DURAPREP 26ML APPLICATOR (WOUND CARE) ×4 IMPLANT
ELECT REM PT RETURN 9FT ADLT (ELECTROSURGICAL) ×2
ELECTRODE REM PT RTRN 9FT ADLT (ELECTROSURGICAL) ×1 IMPLANT
GLOVE BIOGEL M 7.0 STRL (GLOVE) IMPLANT
GLOVE BIOGEL PI IND STRL 7.5 (GLOVE) IMPLANT
GLOVE BIOGEL PI IND STRL 8.5 (GLOVE) ×1 IMPLANT
GLOVE BIOGEL PI INDICATOR 7.5 (GLOVE)
GLOVE BIOGEL PI INDICATOR 8.5 (GLOVE) ×1
GLOVE SURG ORTHO 8.0 STRL STRW (GLOVE) ×4 IMPLANT
GOWN STRL REUS W/ TWL LRG LVL3 (GOWN DISPOSABLE) ×1 IMPLANT
GOWN STRL REUS W/ TWL XL LVL3 (GOWN DISPOSABLE) ×1 IMPLANT
GOWN STRL REUS W/TWL 2XL LVL3 (GOWN DISPOSABLE) ×2 IMPLANT
GOWN STRL REUS W/TWL LRG LVL3 (GOWN DISPOSABLE) ×1
GOWN STRL REUS W/TWL XL LVL3 (GOWN DISPOSABLE) ×1
HANDPIECE INTERPULSE COAX TIP (DISPOSABLE) ×1
HOOD PEEL AWAY FACE SHEILD DIS (HOOD) ×6 IMPLANT
KIT BASIN OR (CUSTOM PROCEDURE TRAY) ×2 IMPLANT
KIT ROOM TURNOVER OR (KITS) ×2 IMPLANT
KNEE CAPITATED TOTAL 3 ×1 IMPLANT
MANIFOLD NEPTUNE II (INSTRUMENTS) ×2 IMPLANT
NEEDLE 22X1 1/2 (OR ONLY) (NEEDLE) ×4 IMPLANT
NS IRRIG 1000ML POUR BTL (IV SOLUTION) ×2 IMPLANT
PACK TOTAL JOINT (CUSTOM PROCEDURE TRAY) ×2 IMPLANT
PAD ARMBOARD 7.5X6 YLW CONV (MISCELLANEOUS) ×4 IMPLANT
SET HNDPC FAN SPRY TIP SCT (DISPOSABLE) ×1 IMPLANT
STRIP CLOSURE SKIN 1/2X4 (GAUZE/BANDAGES/DRESSINGS) ×2 IMPLANT
SUCTION FRAZIER HANDLE 10FR (MISCELLANEOUS)
SUCTION TUBE FRAZIER 10FR DISP (MISCELLANEOUS) IMPLANT
SUT MNCRL AB 3-0 PS2 18 (SUTURE) ×2 IMPLANT
SUT VIC AB 0 CTB1 27 (SUTURE) ×4 IMPLANT
SUT VIC AB 1 CT1 27 (SUTURE) ×3
SUT VIC AB 1 CT1 27XBRD ANBCTR (SUTURE) ×3 IMPLANT
SUT VIC AB 2-0 CT1 27 (SUTURE) ×3
SUT VIC AB 2-0 CT1 TAPERPNT 27 (SUTURE) ×3 IMPLANT
SYR 20CC LL (SYRINGE) ×4 IMPLANT
TOWEL OR 17X24 6PK STRL BLUE (TOWEL DISPOSABLE) ×2 IMPLANT
TOWEL OR 17X26 10 PK STRL BLUE (TOWEL DISPOSABLE) ×2 IMPLANT
TRAY CATH 16FR W/PLASTIC CATH (SET/KITS/TRAYS/PACK) IMPLANT
WRAP KNEE MAXI GEL POST OP (GAUZE/BANDAGES/DRESSINGS) ×2 IMPLANT

## 2016-02-27 NOTE — Progress Notes (Signed)
Orthopedic Tech Progress Note Patient Details:  Kevin Ramirez 1945/10/23 [id number]  CPM Left Knee CPM Left Knee: On Left Knee Flexion (Degrees): 90 Left Knee Extension (Degrees): 0 Additional Comments: foot roll   [name] 02/27/2016, 10:59 AM

## 2016-02-27 NOTE — Anesthesia Procedure Notes (Addendum)
Anesthesia Regional Block:  Adductor canal block  Pre-Anesthetic Checklist: ,, timeout performed, Correct Patient, Correct Site, Correct Laterality, Correct Procedure, Correct Position, site marked, Risks and benefits discussed,  Surgical consent,  Pre-op evaluation,  At surgeon's request and post-op pain management  Laterality: Left  Prep: chloraprep       Needles:  Injection technique: Single-shot  Needle Type: Echogenic Stimulator Needle     Needle Length: 9cm 9 cm Needle Gauge: 21 and 21 G    Additional Needles:  Procedures: ultrasound guided (picture in chart) Adductor canal block Narrative:  Injection made incrementally with aspirations every 5 mL.  Performed by: Personally  Anesthesiologist: Josephine Igo  Additional Notes: Timeout performed. Patient sedated. Relevant anatomy ID'd using [place]. Incremental 44ml injection with frequent aspiration.

## 2016-02-27 NOTE — Progress Notes (Signed)
Pt states that he will wear his CPAP tonight once he is ready for bed. Machine set up and education given to patient on the proper use of the machine. Pt verbalize understanding.

## 2016-02-27 NOTE — Anesthesia Postprocedure Evaluation (Signed)
Anesthesia Post Note  Patient: GERNARD SAUCERMAN  Procedure(s) Performed: Procedure(s) (LRB): TOTAL KNEE ARTHROPLASTY (Left)  Patient location during evaluation: PACU Anesthesia Type: Regional and Spinal Level of consciousness: oriented and awake and alert Pain management: pain level controlled Vital Signs Assessment: post-procedure vital signs reviewed and stable Respiratory status: spontaneous breathing, respiratory function stable and nonlabored ventilation Cardiovascular status: blood pressure returned to baseline and stable Postop Assessment: no headache, no backache, spinal receding and no signs of nausea or vomiting Anesthetic complications: no Comments: No complaints of pain.        Last Vitals:  Vitals:   02/27/16 1035 02/27/16 1050  BP: 118/76 120/71  Pulse: (!) 57 (!) 56  Resp: 16 14  Temp:      Last Pain:  Vitals:   02/27/16 0622  TempSrc: Oral  PainSc:                  Chinmay Squier A.

## 2016-02-27 NOTE — Anesthesia Procedure Notes (Signed)
Spinal  Start time: 02/27/2016 7:32 AM Staffing Anesthesiologist: Josephine Igo Performed: anesthesiologist  Preanesthetic Checklist Completed: patient identified, site marked, surgical consent, pre-op evaluation, timeout performed, IV checked, risks and benefits discussed and monitors and equipment checked Spinal Block Patient position: sitting Prep: DuraPrep Patient monitoring: heart rate, continuous pulse ox and blood pressure Approach: midline Location: L3-4 Injection technique: single-shot Needle Needle type: Pencan  Needle gauge: 24 G Needle length: 9 cm Needle insertion depth: 5 cm Assessment Sensory level: T6 Additional Notes Patient tolerated procedure well. Adequate sensory level.

## 2016-02-27 NOTE — Evaluation (Signed)
Physical Therapy Evaluation Patient Details Name: Kevin Ramirez MRN: [id number] DOB: April 09, 1945 Today's Date: 02/27/2016   History of Present Illness  Patient is a 71 y/o male admitted for L TKA.  PMH positive for sleep apnea and HTN.  Clinical Impression  Patient presents with decreased mobility due to deficits listed in PT problem list.  He will benefit from skilled PT in the acute setting to allow return home with family support and follow up HHPT.     Follow Up Recommendations Home health PT    Equipment Recommendations  None recommended by PT    Recommendations for Other Services       Precautions / Restrictions Precautions Precautions: Knee;Fall      Mobility  Bed Mobility Overal bed mobility: Needs Assistance Bed Mobility: Supine to Sit     Supine to sit: Supervision     General bed mobility comments: assist for lines  Transfers Overall transfer level: Needs assistance Equipment used: Rolling walker (2 wheeled) Transfers: Sit to/from Stand Sit to Stand: Min guard         General transfer comment: cues for hand placement and L LE positioning  Ambulation/Gait Ambulation/Gait assistance: Min guard Ambulation Distance (Feet): 60 Feet Assistive device: Rolling walker (2 wheeled) Gait Pattern/deviations: Step-through pattern;Step-to pattern;Decreased stride length     General Gait Details: short steps and cues for WBAT and sequencing with walker  Stairs            Wheelchair Mobility    Modified Rankin (Stroke Patients Only)       Balance Overall balance assessment: No apparent balance deficits (not formally assessed)                                           Pertinent Vitals/Pain Pain Assessment: 0-10 Pain Score: 3  Pain Location: L knee Pain Descriptors / Indicators: Sore Pain Intervention(s): Monitored during session;Repositioned    Home Living Family/patient expects to be discharged to:: Private  residence Living Arrangements: Spouse/significant other Available Help at Discharge: Family;Available 24 hours/day Type of Home: House Home Access: Stairs to enter Entrance Stairs-Rails: Left Entrance Stairs-Number of Steps: 2 Home Layout: Two level;Able to live on main level with bedroom/bathroom Home Equipment: Shower seat;Cane - single point;Walker - 2 wheels;Crutches;Bedside commode Additional Comments: gplfs, plays tennis, goes to [employer]    Prior Function Level of Independence: Independent               Hand Dominance   Dominant Hand: Right    Extremity/Trunk Assessment   Upper Extremity Assessment Upper Extremity Assessment: Overall WFL for tasks assessed    Lower Extremity Assessment Lower Extremity Assessment: LLE deficits/detail LLE Deficits / Details: ankle AROM WFL, strength positive SLR minor quad lag, AROM grossly 70 degrees knee flexion close to neutral extension       Communication   Communication: No difficulties  Cognition Arousal/Alertness: Awake/alert Behavior During Therapy: WFL for tasks assessed/performed Overall Cognitive Status: Within Functional Limits for tasks assessed                      General Comments      Exercises Total Joint Exercises Ankle Circles/Pumps: AROM;Both;10 reps;Supine Quad Sets: AROM;Left;Supine;10 reps Short Arc Quad: AROM;Left;Supine;10 reps Heel Slides: AROM;Left;Supine;10 reps Straight Leg Raises: AROM;Left;Supine;10 reps   Assessment/Plan    PT Assessment Patient needs continued PT services  PT Problem  List Decreased knowledge of use of DME;Decreased strength;Decreased range of motion;Decreased knowledge of precautions;Pain          PT Treatment Interventions DME instruction;Gait training;Stair training;Balance training;Functional mobility training;Therapeutic activities;Therapeutic exercise;Patient/family education    PT Goals (Current goals can be found in the Care Plan section)  Acute Rehab PT  Goals Patient Stated Goal: To return to independent PT Goal Formulation: With patient Time For Goal Achievement: 03/01/16 Potential to Achieve Goals: Good    Frequency 7X/week   Barriers to discharge        Co-evaluation               End of Session Equipment Utilized During Treatment: Gait belt Activity Tolerance: Patient tolerated treatment well Patient left: in chair;with call bell/phone within reach           Time: 1659-1726 PT Time Calculation (min) (ACUTE ONLY): 27 min   Charges:   PT Evaluation $PT Eval Low Complexity: 1 Procedure PT Treatments $Gait Training: 8-22 mins   PT G CodesElray Mcgregor 2016/03/24, 5:52 PM  Sheran Lawless, PT [phone] 24-Mar-2016

## 2016-02-27 NOTE — Transfer of Care (Signed)
Immediate Anesthesia Transfer of Care Note  Patient: Kevin Ramirez  Procedure(s) Performed: Procedure(s): TOTAL KNEE ARTHROPLASTY (Left)  Patient Location: PACU  Anesthesia Type:Regional and Spinal  Level of Consciousness: awake, alert  and oriented  Airway & Oxygen Therapy: Patient Spontanous Breathing  Post-op Assessment: Report given to RN and Post -op Vital signs reviewed and stable  Post vital signs: Reviewed and stable  Last Vitals:  Vitals:   02/27/16 0622  BP: (!) 155/84  Pulse: 66  Resp: 18  Temp: 36.6 C    Last Pain:  Vitals:   02/27/16 0622  TempSrc: Oral  PainSc:       Patients Stated Pain Goal: 4 ([phone])  Complications: No apparent anesthesia complications

## 2016-02-27 NOTE — Progress Notes (Signed)
Pt Is on NIV QHS at this time tolerating it well.

## 2016-02-28 ENCOUNTER — Encounter (HOSPITAL_COMMUNITY): Payer: Self-pay | Admitting: Orthopedic Surgery

## 2016-02-28 LAB — BASIC METABOLIC PANEL
ANION GAP: 7 (ref 5–15)
BUN: 19 mg/dL (ref 6–20)
CALCIUM: 9.1 mg/dL (ref 8.9–10.3)
CO2: 27 mmol/L (ref 22–32)
Chloride: 104 mmol/L (ref 101–111)
Creatinine, Ser: 1.03 mg/dL (ref 0.61–1.24)
GLUCOSE: 113 mg/dL — AB (ref 65–99)
Potassium: 4.2 mmol/L (ref 3.5–5.1)
SODIUM: 138 mmol/L (ref 135–145)

## 2016-02-28 LAB — CBC
HCT: 38.4 % — ABNORMAL LOW (ref 39.0–52.0)
Hemoglobin: 13.1 g/dL (ref 13.0–17.0)
MCH: 30.3 pg (ref 26.0–34.0)
MCHC: 34.1 g/dL (ref 30.0–36.0)
MCV: 88.7 fL (ref 78.0–100.0)
PLATELETS: 201 10*3/uL (ref 150–400)
RBC: 4.33 MIL/uL (ref 4.22–5.81)
RDW: 12.6 % (ref 11.5–15.5)
WBC: 15.9 10*3/uL — AB (ref 4.0–10.5)

## 2016-02-28 MED ORDER — METHOCARBAMOL 500 MG PO TABS: 500.0000 mg | ORAL_TABLET | Freq: Four times a day (QID) | ORAL | 0 refills | Status: DC | PRN

## 2016-02-28 MED ORDER — ASPIRIN 325 MG PO TBEC: 325.0000 mg | DELAYED_RELEASE_TABLET | Freq: Two times a day (BID) | ORAL | 0 refills | Status: DC

## 2016-02-28 MED ORDER — OXYCODONE HCL 10 MG PO TABS: 10.0000 mg | ORAL_TABLET | ORAL | 0 refills | Status: DC | PRN

## 2016-02-28 NOTE — Progress Notes (Signed)
Physical Therapy Treatment Patient Details Name: Kevin Ramirez MRN: [id number] DOB: 1945-06-19 Today's Date: 02/28/2016    History of Present Illness Patient is a 71 y/o male admitted for L TKA.  PMH positive for sleep apnea and HTN.    PT Comments    Patient is making good progress with PT.  From a mobility standpoint anticipate patient will be ready for DC home when medically ready.     Follow Up Recommendations  Home health PT     Equipment Recommendations  None recommended by PT    Recommendations for Other Services       Precautions / Restrictions Precautions Precautions: Knee;Fall Precaution Comments: reviewed precautions and positioning; pt has good understanding Restrictions Weight Bearing Restrictions: Yes LLE Weight Bearing: Weight bearing as tolerated    Mobility  Bed Mobility Overal bed mobility: Modified Independent Bed Mobility: Supine to Sit           General bed mobility comments: increased time and effort  Transfers Overall transfer level: Modified independent Equipment used: Rolling walker (2 wheeled) Transfers: Sit to/from Stand              Ambulation/Gait Ambulation/Gait assistance: Supervision Ambulation Distance (Feet): 200 Feet Assistive device: Rolling walker (2 wheeled) Gait Pattern/deviations: Step-through pattern;Decreased stride length     General Gait Details: steady gait; cues for posture and sequencing   Stairs Stairs: Yes   Stair Management: One rail Left;Step to pattern;Forwards Number of Stairs: 10 General stair comments: cues for sequencing and technique; min guard for safety; no physical assistance needed  Wheelchair Mobility    Modified Rankin (Stroke Patients Only)       Balance                                    Cognition Arousal/Alertness: Awake/alert Behavior During Therapy: WFL for tasks assessed/performed Overall Cognitive Status: Within Functional Limits for tasks  assessed                      Exercises Total Joint Exercises Quad Sets: AROM;Left;10 reps Heel Slides: AROM;Left;10 reps Straight Leg Raises: AROM;Left;10 reps Goniometric ROM: 3-92    General Comments        Pertinent Vitals/Pain Pain Assessment: 0-10 Pain Score: 2  Pain Location: L knee Pain Descriptors / Indicators: Sore Pain Intervention(s): Monitored during session;Premedicated before session;Repositioned;Ice applied    Home Living                      Prior Function            PT Goals (current goals can now be found in the care plan section) Acute Rehab PT Goals Patient Stated Goal: To return to independent Progress towards PT goals: Progressing toward goals    Frequency    7X/week      PT Plan Current plan remains appropriate    Co-evaluation             End of Session Equipment Utilized During Treatment: Gait belt Activity Tolerance: Patient tolerated treatment well Patient left: in chair;with call bell/phone within reach     Time: 6578-4696 PT Time Calculation (min) (ACUTE ONLY): 42 min  Charges:  $Gait Training: 8-22 mins $Therapeutic Exercise: 8-22 mins $Therapeutic Activity: 8-22 mins                    G Codes:  Derek Mound, PTA Pager: [phone]   02/28/2016, 9:49 AM

## 2016-02-28 NOTE — Progress Notes (Signed)
Orthopedic Tech Progress Note Patient Details:  Kevin Ramirez 10-07-1945 [id number]  Patient ID: [name], male   DOB: 1945/03/21, 71 y.o.   MRN: [id number] Applied cpm 0-70  Karolee Stamps 02/28/2016, 6:34 AM

## 2016-02-28 NOTE — Progress Notes (Signed)
SPORTS MEDICINE AND JOINT REPLACEMENT  Lara Mulch, MD    Carlyon Shadow, PA-C [place], [place], [place]  [address]                             [phone]   PROGRESS NOTE  Subjective:  negative for Chest Pain  negative for Shortness of Breath  negative for Nausea/Vomiting   negative for Calf Pain  negative for Bowel Movement   Tolerating Diet: yes         Patient reports pain as 4 on 0-10 scale.    Objective: Vital signs in last 24 hours:   Patient Vitals for the past 24 hrs:  BP Temp Temp src Pulse Resp SpO2  02/28/16 0531 127/75 98 F (36.7 C) Oral 60 16 95 %  02/28/16 0001 132/81 98.2 F (36.8 C) Oral 65 16 95 %  02/27/16 2030 125/72 97.5 F (36.4 C) Oral 72 16 97 %  02/27/16 1700 (!) 144/84 97.6 F (36.4 C) Oral 73 16 97 %  02/27/16 1500 128/64 98.1 F (36.7 C) - (!) 56 16 97 %  02/27/16 1330 - - - (!) 58 14 96 %  02/27/16 1306 125/64 - - (!) 58 16 97 %  02/27/16 1205 120/74 97.8 F (36.6 C) - 64 15 97 %  02/27/16 1120 121/69 - - (!) 55 14 95 %  02/27/16 1105 119/73 - - (!) 54 15 95 %  02/27/16 1050 120/71 - - (!) 56 14 97 %  02/27/16 1035 118/76 - - (!) 57 16 94 %  02/27/16 1020 130/77 - - (!) 59 13 98 %  02/27/16 1005 122/70 - - 63 17 94 %  02/27/16 0950 122/76 97.5 F (36.4 C) - 68 - 96 %    @flow {1959:LAST@   Intake/Output from previous day:   01/08 0701 - 01/09 0700 In: 1450 [P.O.:150; I.V.:1200] Out: 2125 [Urine:2075]   Intake/Output this shift:   No intake/output data recorded.   Intake/Output      01/08 0701 - 01/09 0700 01/09 0701 - 01/10 0700   P.O. 150    I.V. (mL/kg) 1200 (12.3)    IV Piggyback 100    Total Intake(mL/kg) 1450 (14.9)    Urine (mL/kg/hr) 2075 (0.9)    Blood 50 (0)    Total Output 2125     Net -675             LABORATORY DATA:  Recent Labs  02/24/16 1144  WBC 6.3  HGB 15.5  HCT 45.2  PLT 220    Recent Labs  02/24/16 1144  NA 139  K 4.1  CL 103  CO2 24  BUN 16  CREATININE 1.05  GLUCOSE  92  CALCIUM 9.8   No results found for: INR, PROTIME  Examination:  General appearance: alert, cooperative and no distress Extremities: extremities normal, atraumatic, no cyanosis or edema  Wound Exam: clean, dry, intact   Drainage:  None: wound tissue dry  Motor Exam: Quadriceps and Hamstrings Intact  Sensory Exam: Superficial Peroneal, Deep Peroneal and Tibial normal   Assessment:    1 Day Post-Op  Procedure(s) (LRB): TOTAL KNEE ARTHROPLASTY (Left)  ADDITIONAL DIAGNOSIS:  Active Problems:   S/P total knee replacement    Plan: Physical Therapy as ordered Weight Bearing as Tolerated (WBAT)  DVT Prophylaxis:  Aspirin  DISCHARGE PLAN: Home  DISCHARGE NEEDS: HHPT   Patient doing well, anticipate D/C home today  Donia Ast 02/28/2016, 7:09 AM

## 2016-02-28 NOTE — Op Note (Signed)
TOTAL KNEE REPLACEMENT OPERATIVE NOTE:  02/27/2016  12:02 PM  PATIENT:  Kevin Ramirez  71 y.o. male  PRE-OPERATIVE DIAGNOSIS:  primary osteoarthritis left knee   POST-OPERATIVE DIAGNOSIS:  primary osteoarthritis left knee   PROCEDURE:  Procedure(s): TOTAL KNEE ARTHROPLASTY  SURGEON:  Tao Satz, MD  ASSISTANTUnk Lightning, PAC   ANESTHESIA:   spinal  DRAINS: Hemovac  SPECIMEN: None  COUNTS:  Correct  TOURNIQUET:   Total Tourniquet Time Documented: Thigh (Left) - 55 minutes Total: Thigh (Left) - 55 minutes   DICTATION:  Indication for procedure:    The patient is a 71 y.o. male who has failed conservative treatment for primary osteoarthritis left knee .  Informed consent was obtained prior to anesthesia. The risks versus benefits of the operation were explain and in a way the patient can, and did, understand.   On the implant demand matching protocol, this patient scored 10.  Therefore, this patient was not receive a polyethylene insert with vitamin E which is a high demand implant.  Description of procedure:     The patient was taken to the operating room and placed under anesthesia.  The patient was positioned in the usual fashion taking care that all body parts were adequately padded and/or protected.  I foley catheter was not placed.  A tourniquet was applied and the leg prepped and draped in the usual sterile fashion.  The extremity was exsanguinated with the esmarch and tourniquet inflated to 350 mmHg.  Pre-operative range of motion was normal.  The knee was in 5 degree of mild varus.  A midline incision approximately 6-7 inches long was made with a #10 blade.  A new blade was used to make a parapatellar arthrotomy going 2-3 cm into the quadriceps tendon, over the patella, and alongside the medial aspect of the patellar tendon.  A synovectomy was then performed with the #10 blade and forceps. I then elevated the deep MCL off the medial tibial metaphysis subperiosteally  around to the semimembranosus attachment.    I everted the patella and used calipers to measure patellar thickness.  I used the reamer to ream down to appropriate thickness to recreate the native thickness.  I then removed excess bone with the rongeur and sagittal saw.  I used the appropriately sized template and drilled the three lug holes.  I then put the trial in place and measured the thickness with the calipers to ensure recreation of the native thickness.  The trial was then removed and the patella subluxed and the knee brought into flexion.  A homan retractor was place to retract and protect the patella and lateral structures.  A Z-retractor was place medially to protect the medial structures.  The extra-medullary alignment system was used to make cut the tibial articular surface perpendicular to the anamotic axis of the tibia and in 3 degrees of posterior slope.  The cut surface and alignment jig was removed.  I then used the intramedullary alignment guide to make a 6 valgus cut on the distal femur.  I then marked out the epicondylar axis on the distal femur.  The posterior condylar axis measured 3 degrees.  I then used the anterior referencing sizer and measured the femur to be a size 11.  The 4-In-1 cutting block was screwed into place in external rotation matching the posterior condylar angle, making our cuts perpendicular to the epicondylar axis.  Anterior, posterior and chamfer cuts were made with the sagittal saw.  The cutting block and cut pieces were  removed.  A lamina spreader was placed in 90 degrees of flexion.  The ACL, PCL, menisci, and posterior condylar osteophytes were removed.  A 11 mm spacer blocked was found to offer good flexion and extension gap balance after minimal in degree releasing.   The scoop retractor was then placed and the femoral finishing block was pinned in place.  The small sagittal saw was used as well as the lug drill to finish the femur.  The block and cut  surfaces were removed and the medullary canal hole filled with autograft bone from the cut pieces.  The tibia was delivered forward in deep flexion and external rotation.  A size G tray was selected and pinned into place centered on the medial 1/3 of the tibial tubercle.  The reamer and keel was used to prepare the tibia through the tray.    I then trialed with the size 11 femur, size G tibia, a 11 mm insert and the 35 patella.  I had excellent flexion/extension gap balance, excellent patella tracking.  Flexion was full and beyond 120 degrees; extension was zero.  These components were chosen and the staff opened them to me on the back table while the knee was lavaged copiously and the cement mixed.  The soft tissue was infiltrated with 60cc of exparel 1.3% through a 21 gauge needle.  I cemented in the components and removed all excess cement.  The polyethylene tibial component was snapped into place and the knee placed in extension while cement was hardening.  The capsule was infilltrated with 30cc of .25% Marcaine with epinephrine.  A hemovac was place in the joint exiting superolaterally.  A pain pump was place superomedially superficial to the arthrotomy.  Once the cement was hard, the tourniquet was let down.  Hemostasis was obtained.  The arthrotomy was closed with figure-8 #1 vicryl sutures.  The deep soft tissues were closed with #0 vicryls and the subcuticular layer closed with a running #2-0 vicryl.  The skin was reapproximated and closed with skin staples.  The wound was dressed with xeroform, 4 x4's, 2 ABD sponges, a single layer of webril and a TED stocking.   The patient was then awakened, extubated, and taken to the recovery room in stable condition.  BLOOD LOSS:  300cc DRAINS: 1 hemovac, 1 pain catheter COMPLICATIONS:  None.  PLAN OF CARE: Admit to inpatient   PATIENT DISPOSITION:  PACU - hemodynamically stable.   Delay start of Pharmacological VTE agent (>24hrs) due to surgical  blood loss or risk of bleeding:  not applicable  Please fax a copy of this op note to my office at [phone] (please only include page 1 and 2 of the Case Information op note)

## 2016-02-28 NOTE — Evaluation (Signed)
Occupational Therapy Evaluation Patient Details Name: Kevin Ramirez MRN: [id number] DOB: Dec 27, 1945 Today's Date: 02/28/2016    History of Present Illness Patient is a 71 y/o male admitted for L TKA.  PMH positive for sleep apnea and HTN.   Clinical Impression   Pt is at min A level with LB and ADL and sup with ADL mobility using RW. Pt will have 24/7 assist form his wife at home. All education completed and no further acute OT indicated at this time    Follow Up Recommendations  No OT follow up;Supervision - Intermittent    Equipment Recommendations  3 in 1 bedside commode;Tub/shower seat;Other (comment) (reacher)    Recommendations for Other Services       Precautions / Restrictions Precautions Precautions: Knee;Fall Precaution Comments: reviewed precautions and positioning; pt has good understanding Restrictions Weight Bearing Restrictions: Yes LLE Weight Bearing: Weight bearing as tolerated      Mobility Bed Mobility Overal bed mobility: Modified Independent Bed Mobility: Supine to Sit           General bed mobility comments: pt up in recliner  Transfers Overall transfer level: Modified independent Equipment used: Rolling walker (2 wheeled) Transfers: Sit to/from Stand Sit to Stand: Min guard         General transfer comment: cues for hand placement and L LE positioning    Balance Overall balance assessment: No apparent balance deficits (not formally assessed)                                          ADL Overall ADL's : Needs assistance/impaired     Grooming: Wash/dry hands;Wash/dry face;Standing;Supervision/safety   Upper Body Bathing: Set up   Lower Body Bathing: Minimal assistance   Upper Body Dressing : Set up   Lower Body Dressing: Minimal assistance   Toilet Transfer: Supervision/safety;RW;Ambulation;Comfort height toilet       Tub/ Shower Transfer: Min guard   Functional mobility during ADLs:  Supervision/safety       Vision Vision Assessment?: No apparent visual deficits          Pertinent Vitals/Pain Pain Assessment: 0-10 Pain Score: 2  Pain Location: L knee Pain Descriptors / Indicators: Sore Pain Intervention(s): Monitored during session;Ice applied;Premedicated before session;Repositioned     Hand Dominance Right   Extremity/Trunk Assessment Upper Extremity Assessment Upper Extremity Assessment: Overall WFL for tasks assessed   Lower Extremity Assessment Lower Extremity Assessment: Defer to PT evaluation   Cervical / Trunk Assessment Cervical / Trunk Assessment: Normal   Communication Communication Communication: No difficulties   Cognition Arousal/Alertness: Awake/alert Behavior During Therapy: WFL for tasks assessed/performed Overall Cognitive Status: Within Functional Limits for tasks assessed                     General Comments   pt very pleasant and cooperative                Home Living Family/patient expects to be discharged to:: Private residence Living Arrangements: Spouse/significant other Available Help at Discharge: Family;Available 24 hours/day Type of Home: House Home Access: Stairs to enter [employer] of Steps: 2 Entrance Stairs-Rails: Left Home Layout: Two level;Able to live on main level with bedroom/bathroom Alternate Level Stairs-Number of Steps: 16 Alternate Level Stairs-Rails: Can reach both;Left Bathroom Shower/Tub: Producer, television/film/video: Standard     Home Equipment: Production assistant, radio -  single point;Walker - 2 wheels;Crutches;Bedside commode   Additional Comments: golfs, plays tennis, goes to [employer]      Prior Functioning/Environment Level of Independence: Independent                 OT Problem List: Decreased activity tolerance;Pain   OT Treatment/Interventions:      OT Goals(Current goals can be found in the care plan section) Acute Rehab OT Goals Patient Stated Goal: To  return to independent OT Goal Formulation: With patient Time For Goal Achievement: 02/21/16 Potential to Achieve Goals: Good  OT Frequency:     Barriers to D/C:    no barriers                     End of Session Equipment Utilized During Treatment: Gait belt;Rolling walker;Other (comment) (3 in 1) CPM Left Knee CPM Left Knee: Off  Activity Tolerance: Patient tolerated treatment well Patient left: in chair;with call bell/phone within reach   Time: 1610-9604 OT Time Calculation (min): 25 min Charges:  OT General Charges $OT Visit: 1 Procedure OT Evaluation $OT Eval Moderate Complexity: 1 Procedure OT Treatments $Therapeutic Activity: 8-22 mins G-Codes:    Galen Manila 02/28/2016, 11:21 AM

## 2016-02-28 NOTE — Progress Notes (Signed)
Pt ready for discharge. Education/instructions reviewed with pt and  Spouse, and all questions concerns addressed. IV removed and belongings gathered. Pt will be transported out via wheelchair to wife's vehicle. Will continue to monitor

## 2016-02-28 NOTE — Discharge Summary (Signed)
SPORTS MEDICINE & JOINT REPLACEMENT   Georgena Spurling, MD   Laurier Nancy, PA-C [address] [place], [place], [place]  [address]                             [phone]  PATIENT ID: Kevin Ramirez        MRN:  [id number]          DOB/AGE: 08-06-1945 / 71 y.o.    DISCHARGE SUMMARY  ADMISSION DATE:    02/27/2016 DISCHARGE DATE:   02/28/2016   ADMISSION DIAGNOSIS: primary osteoarthritis left knee     DISCHARGE DIAGNOSIS:  primary osteoarthritis left knee     ADDITIONAL DIAGNOSIS: Active Problems:   S/P total knee replacement  Past Medical History:  Diagnosis Date  . Arthritis   . Hypertension   . Sleep apnea    cpap    PROCEDURE: Procedure(s): TOTAL KNEE ARTHROPLASTY on 02/27/2016  CONSULTS:    HISTORY:  See H&P in chart  HOSPITAL COURSE:  Kevin Ramirez is a 71 y.o. admitted on 02/27/2016 and found to have a diagnosis of primary osteoarthritis left knee .  After appropriate laboratory studies were obtained  they were taken to the operating room on 02/27/2016 and underwent Procedure(s): TOTAL KNEE ARTHROPLASTY.   They were given perioperative antibiotics:  Anti-infectives    Start     Dose/Rate Route Frequency Ordered Stop   02/27/16 1800  ceFAZolin (ANCEF) IVPB 1 g/50 mL premix     1 g 100 mL/hr over 30 Minutes Intravenous Every 6 hours 02/27/16 1007 02/28/16 0258   02/27/16 0630  ceFAZolin (ANCEF) IVPB 2g/100 mL premix     2 g 200 mL/hr over 30 Minutes Intravenous To ShortStay Surgical 02/26/16 0807 02/27/16 0740    .  Patient given tranexamic acid IV or topical and exparel intra-operatively.  Tolerated the procedure well.    POD# 1: Vital signs were stable.  Patient denied Chest pain, shortness of breath, or calf pain.  Patient was started on Lovenox 30 mg subcutaneously twice daily at 8am.  Consults to PT, OT, and care management were made.  The patient was weight bearing as tolerated.  CPM was placed on the operative leg 0-90 degrees for 6-8 hours a day. When out  of the CPM, patient was placed in the foam block to achieve full extension. Incentive spirometry was taught.  Dressing was changed.       POD #2, Continued  PT for ambulation and exercise program.  IV saline locked.  O2 discontinued.    The remainder of the hospital course was dedicated to ambulation and strengthening.   The patient was discharged on 1 Day Post-Op in  Good condition.  Blood products given:none  DIAGNOSTIC STUDIES: Recent vital signs: Patient Vitals for the past 24 hrs:  BP Temp Temp src Pulse Resp SpO2  02/28/16 0531 127/75 98 F (36.7 C) Oral 60 16 95 %  02/28/16 0001 132/81 98.2 F (36.8 C) Oral 65 16 95 %  02/27/16 2030 125/72 97.5 F (36.4 C) Oral 72 16 97 %  02/27/16 1700 (!) 144/84 97.6 F (36.4 C) Oral 73 16 97 %  02/27/16 1500 128/64 98.1 F (36.7 C) - (!) 56 16 97 %  02/27/16 1330 - - - (!) 58 14 96 %  02/27/16 1306 125/64 - - (!) 58 16 97 %  02/27/16 1205 120/74 97.8 F (36.6 C) - 64 15 97 %  Recent laboratory studies:  Recent Labs  02/24/16 1144 02/28/16 0843  WBC 6.3 15.9*  HGB 15.5 13.1  HCT 45.2 38.4*  PLT 220 201    Recent Labs  02/24/16 1144 02/28/16 0843  NA 139 138  K 4.1 4.2  CL 103 104  CO2 24 27  BUN 16 19  CREATININE 1.05 1.03  GLUCOSE 92 113*  CALCIUM 9.8 9.1   No results found for: INR, PROTIME   Recent Radiographic Studies :  No results found.  DISCHARGE INSTRUCTIONS: Discharge Instructions    CPM    Complete by:  As directed    Continuous passive motion machine (CPM):      Use the CPM from 0 to 90 for 4-6 hours per day.      You may increase by 10 per day.  You may break it up into 2 or 3 sessions per day.      Use CPM for 2 weeks or until you are told to stop.   Call MD / Call 911    Complete by:  As directed    If you experience chest pain or shortness of breath, CALL 911 and be transported to the hospital emergency room.  If you develope a fever above 101 F, pus (white drainage) or increased  drainage or redness at the wound, or calf pain, call your surgeon's office.   Constipation Prevention    Complete by:  As directed    Drink plenty of fluids.  Prune juice may be helpful.  You may use a stool softener, such as Colace (over the counter) 100 mg twice a day.  Use MiraLax (over the counter) for constipation as needed.   Diet - low sodium heart healthy    Complete by:  As directed    Discharge instructions    Complete by:  As directed    INSTRUCTIONS AFTER JOINT REPLACEMENT   Remove items at home which could result in a fall. This includes throw rugs or furniture in walking pathways ICE to the affected joint every three hours while awake for 30 minutes at a time, for at least the first 3-5 days, and then as needed for pain and swelling.  Continue to use ice for pain and swelling. You may notice swelling that will progress down to the foot and ankle.  This is normal after surgery.  Elevate your leg when you are not up walking on it.   Continue to use the breathing machine you got in the hospital (incentive spirometer) which will help keep your temperature down.  It is common for your temperature to cycle up and down following surgery, especially at night when you are not up moving around and exerting yourself.  The breathing machine keeps your lungs expanded and your temperature down.   DIET:  As you were doing prior to hospitalization, we recommend a well-balanced diet.  DRESSING / WOUND CARE / SHOWERING  Keep the surgical dressing until follow up.  The dressing is water proof, so you can shower without any extra covering.  IF THE DRESSING FALLS OFF or the wound gets wet inside, change the dressing with sterile gauze.  Please use good hand washing techniques before changing the dressing.  Do not use any lotions or creams on the incision until instructed by your surgeon.    ACTIVITY  Increase activity slowly as tolerated, but follow the weight bearing instructions below.   No  driving for 6 weeks or until further direction given by your  physician.  You cannot drive while taking narcotics.  No lifting or carrying greater than 10 lbs. until further directed by your surgeon. Avoid periods of inactivity such as sitting longer than an hour when not asleep. This helps prevent blood clots.  You may return to work once you are authorized by your doctor.     WEIGHT BEARING   Weight bearing as tolerated with assist device (walker, cane, etc) as directed, use it as long as suggested by your surgeon or therapist, typically at least 4-6 weeks.   EXERCISES  Results after joint replacement surgery are often greatly improved when you follow the exercise, range of motion and muscle strengthening exercises prescribed by your doctor. Safety measures are also important to protect the joint from further injury. Any time any of these exercises cause you to have increased pain or swelling, decrease what you are doing until you are comfortable again and then slowly increase them. If you have problems or questions, call your caregiver or physical therapist for advice.   Rehabilitation is important following a joint replacement. After just a few days of immobilization, the muscles of the leg can become weakened and shrink (atrophy).  These exercises are designed to build up the tone and strength of the thigh and leg muscles and to improve motion. Often times heat used for twenty to thirty minutes before working out will loosen up your tissues and help with improving the range of motion but do not use heat for the first two weeks following surgery (sometimes heat can increase post-operative swelling).   These exercises can be done on a training (exercise) mat, on the floor, on a table or on a bed. Use whatever works the best and is most comfortable for you.    Use music or television while you are exercising so that the exercises are a pleasant break in your day. This will make your life better  with the exercises acting as a break in your routine that you can look forward to.   Perform all exercises about fifteen times, three times per day or as directed.  You should exercise both the operative leg and the other leg as well.   Exercises include:   Quad Sets - Tighten up the muscle on the front of the thigh (Quad) and hold for 5-10 seconds.   Straight Leg Raises - With your knee straight (if you were given a brace, keep it on), lift the leg to 60 degrees, hold for 3 seconds, and slowly lower the leg.  Perform this exercise against resistance later as your leg gets stronger.  Leg Slides: Lying on your back, slowly slide your foot toward your buttocks, bending your knee up off the floor (only go as far as is comfortable). Then slowly slide your foot back down until your leg is flat on the floor again.  Angel Wings: Lying on your back spread your legs to the side as far apart as you can without causing discomfort.  Hamstring Strength:  Lying on your back, push your heel against the floor with your leg straight by tightening up the muscles of your buttocks.  Repeat, but this time bend your knee to a comfortable angle, and push your heel against the floor.  You may put a pillow under the heel to make it more comfortable if necessary.   A rehabilitation program following joint replacement surgery can speed recovery and prevent re-injury in the future due to weakened muscles. Contact your doctor or a physical  therapist for more information on knee rehabilitation.    CONSTIPATION  Constipation is defined medically as fewer than three stools per week and severe constipation as less than one stool per week.  Even if you have a regular bowel pattern at home, your normal regimen is likely to be disrupted due to multiple reasons following surgery.  Combination of anesthesia, postoperative narcotics, change in appetite and fluid intake all can affect your bowels.   YOU MUST use at least one of the  following options; they are listed in order of increasing strength to get the job done.  They are all available over the counter, and you may need to use some, POSSIBLY even all of these options:    Drink plenty of fluids (prune juice may be helpful) and high fiber foods Colace 100 mg by mouth twice a day  Senokot for constipation as directed and as needed Dulcolax (bisacodyl), take with full glass of water  Miralax (polyethylene glycol) once or twice a day as needed.  If you have tried all these things and are unable to have a bowel movement in the first 3-4 days after surgery call either your surgeon or your primary doctor.    If you experience loose stools or diarrhea, hold the medications until you stool forms back up.  If your symptoms do not get better within 1 week or if they get worse, check with your doctor.  If you experience "the worst abdominal pain ever" or develop nausea or vomiting, please contact the office immediately for further recommendations for treatment.   ITCHING:  If you experience itching with your medications, try taking only a single pain pill, or even half a pain pill at a time.  You can also use Benadryl over the counter for itching or also to help with sleep.   TED HOSE STOCKINGS:  Use stockings on both legs until for at least 2 weeks or as directed by physician office. They may be removed at night for sleeping.  MEDICATIONS:  See your medication summary on the "After Visit Summary" that nursing will review with you.  You may have some home medications which will be placed on hold until you complete the course of blood thinner medication.  It is important for you to complete the blood thinner medication as prescribed.  PRECAUTIONS:  If you experience chest pain or shortness of breath - call 911 immediately for transfer to the hospital emergency department.   If you develop a fever greater that 101 F, purulent drainage from wound, increased redness or drainage from  wound, foul odor from the wound/dressing, or calf pain - CONTACT YOUR SURGEON.                                                   FOLLOW-UP APPOINTMENTS:  If you do not already have a post-op appointment, please call the office for an appointment to be seen by your surgeon.  Guidelines for how soon to be seen are listed in your "After Visit Summary", but are typically between 1-4 weeks after surgery.  OTHER INSTRUCTIONS:   Knee Replacement:  Do not place pillow under knee, focus on keeping the knee straight while resting. CPM instructions: 0-90 degrees, 2 hours in the morning, 2 hours in the afternoon, and 2 hours in the evening. Place foam block, curve side up  under heel at all times except when in CPM or when walking.  DO NOT modify, tear, cut, or change the foam block in any way.  MAKE SURE YOU:  Understand these instructions.  Get help right away if you are not doing well or get worse.    Thank you for letting us be a part of your medical care team.  It is a privilege we respect greatly.  We hope these instructions will help you stay on track for a fast and full recovery!   Increase activity slowly as tolerated    Complete by:  As directed       DISCHARGE MEDICATIONS:   Allergies as of 02/28/2016      Reactions   No Known Allergies       Medication List    TAKE these medications   aspirin 325 MG EC tablet Take 1 tablet (325 mg total) by mouth 2 (two) times daily. What changed:  medication strength  how much to take  when to take this   hydrochlorothiazide 25 MG tablet Commonly known as:  HYDRODIURIL Take 25 mg by mouth every morning.   methocarbamol 500 MG tablet Commonly known as:  ROBAXIN Take 1-2 tablets (500-1,000 mg total) by mouth every 6 (six) hours as needed for muscle spasms.   multivitamin with minerals Tabs tablet Take 1 tablet by mouth daily.   Oxycodone HCl 10 MG Tabs Take 1 tablet (10 mg total) by mouth every 4 (four) hours as needed for breakthrough  pain.   simvastatin 20 MG tablet Commonly known as:  ZOCOR Take 20 mg by mouth every morning.   zolpidem 10 MG tablet Commonly known as:  AMBIEN Take 1 tablet by mouth at bedtime as needed.            Durable Medical Equipment        Start     Ordered   02/27/16 1639  DME Walker rolling  Once    Question:  Patient needs a walker to treat with the following condition  Answer:  S/P total knee replacement   02/27/16 1638   02/27/16 1639  DME 3 n 1  Once     02/27/16 1638   02/27/16 1639  DME Bedside commode  Once    Question:  Patient needs a bedside commode to treat with the following condition  Answer:  S/P total knee replacement   02/27/16 1638      FOLLOW UP VISIT:    DISPOSITION: HOME VS. SNF  CONDITION:  Good   Guy Sandifer 02/28/2016, 11:46 AM

## 2016-02-29 DIAGNOSIS — Z96652 Presence of left artificial knee joint: Secondary | ICD-10-CM | POA: Diagnosis not present

## 2016-02-29 DIAGNOSIS — M1712 Unilateral primary osteoarthritis, left knee: Secondary | ICD-10-CM | POA: Diagnosis not present

## 2016-03-05 DIAGNOSIS — M25562 Pain in left knee: Secondary | ICD-10-CM | POA: Diagnosis not present

## 2016-03-05 DIAGNOSIS — M25662 Stiffness of left knee, not elsewhere classified: Secondary | ICD-10-CM | POA: Diagnosis not present

## 2016-03-05 DIAGNOSIS — R262 Difficulty in walking, not elsewhere classified: Secondary | ICD-10-CM | POA: Diagnosis not present

## 2016-03-05 DIAGNOSIS — R531 Weakness: Secondary | ICD-10-CM | POA: Diagnosis not present

## 2016-03-09 DIAGNOSIS — R262 Difficulty in walking, not elsewhere classified: Secondary | ICD-10-CM | POA: Diagnosis not present

## 2016-03-09 DIAGNOSIS — M25662 Stiffness of left knee, not elsewhere classified: Secondary | ICD-10-CM | POA: Diagnosis not present

## 2016-03-09 DIAGNOSIS — M25562 Pain in left knee: Secondary | ICD-10-CM | POA: Diagnosis not present

## 2016-03-09 DIAGNOSIS — R531 Weakness: Secondary | ICD-10-CM | POA: Diagnosis not present

## 2016-03-13 DIAGNOSIS — Z96652 Presence of left artificial knee joint: Secondary | ICD-10-CM | POA: Diagnosis not present

## 2016-03-13 DIAGNOSIS — M25462 Effusion, left knee: Secondary | ICD-10-CM | POA: Diagnosis not present

## 2016-03-13 DIAGNOSIS — Z471 Aftercare following joint replacement surgery: Secondary | ICD-10-CM | POA: Diagnosis not present

## 2016-03-14 DIAGNOSIS — M25562 Pain in left knee: Secondary | ICD-10-CM | POA: Diagnosis not present

## 2016-03-14 DIAGNOSIS — R531 Weakness: Secondary | ICD-10-CM | POA: Diagnosis not present

## 2016-03-14 DIAGNOSIS — R262 Difficulty in walking, not elsewhere classified: Secondary | ICD-10-CM | POA: Diagnosis not present

## 2016-03-14 DIAGNOSIS — M25662 Stiffness of left knee, not elsewhere classified: Secondary | ICD-10-CM | POA: Diagnosis not present

## 2016-03-16 DIAGNOSIS — M25562 Pain in left knee: Secondary | ICD-10-CM | POA: Diagnosis not present

## 2016-03-16 DIAGNOSIS — R262 Difficulty in walking, not elsewhere classified: Secondary | ICD-10-CM | POA: Diagnosis not present

## 2016-03-16 DIAGNOSIS — M25662 Stiffness of left knee, not elsewhere classified: Secondary | ICD-10-CM | POA: Diagnosis not present

## 2016-03-16 DIAGNOSIS — R531 Weakness: Secondary | ICD-10-CM | POA: Diagnosis not present

## 2016-03-19 DIAGNOSIS — M25562 Pain in left knee: Secondary | ICD-10-CM | POA: Diagnosis not present

## 2016-03-19 DIAGNOSIS — R531 Weakness: Secondary | ICD-10-CM | POA: Diagnosis not present

## 2016-03-19 DIAGNOSIS — M25662 Stiffness of left knee, not elsewhere classified: Secondary | ICD-10-CM | POA: Diagnosis not present

## 2016-03-19 DIAGNOSIS — R262 Difficulty in walking, not elsewhere classified: Secondary | ICD-10-CM | POA: Diagnosis not present

## 2016-03-21 DIAGNOSIS — R531 Weakness: Secondary | ICD-10-CM | POA: Diagnosis not present

## 2016-03-21 DIAGNOSIS — M25562 Pain in left knee: Secondary | ICD-10-CM | POA: Diagnosis not present

## 2016-03-21 DIAGNOSIS — R262 Difficulty in walking, not elsewhere classified: Secondary | ICD-10-CM | POA: Diagnosis not present

## 2016-03-21 DIAGNOSIS — M25662 Stiffness of left knee, not elsewhere classified: Secondary | ICD-10-CM | POA: Diagnosis not present

## 2016-03-26 DIAGNOSIS — M25562 Pain in left knee: Secondary | ICD-10-CM | POA: Diagnosis not present

## 2016-03-26 DIAGNOSIS — R262 Difficulty in walking, not elsewhere classified: Secondary | ICD-10-CM | POA: Diagnosis not present

## 2016-03-26 DIAGNOSIS — M25662 Stiffness of left knee, not elsewhere classified: Secondary | ICD-10-CM | POA: Diagnosis not present

## 2016-03-26 DIAGNOSIS — R531 Weakness: Secondary | ICD-10-CM | POA: Diagnosis not present

## 2016-03-29 DIAGNOSIS — M25662 Stiffness of left knee, not elsewhere classified: Secondary | ICD-10-CM | POA: Diagnosis not present

## 2016-03-29 DIAGNOSIS — R262 Difficulty in walking, not elsewhere classified: Secondary | ICD-10-CM | POA: Diagnosis not present

## 2016-03-29 DIAGNOSIS — M25562 Pain in left knee: Secondary | ICD-10-CM | POA: Diagnosis not present

## 2016-03-29 DIAGNOSIS — R531 Weakness: Secondary | ICD-10-CM | POA: Diagnosis not present

## 2016-04-02 DIAGNOSIS — M25562 Pain in left knee: Secondary | ICD-10-CM | POA: Diagnosis not present

## 2016-04-02 DIAGNOSIS — M25662 Stiffness of left knee, not elsewhere classified: Secondary | ICD-10-CM | POA: Diagnosis not present

## 2016-04-02 DIAGNOSIS — R531 Weakness: Secondary | ICD-10-CM | POA: Diagnosis not present

## 2016-04-02 DIAGNOSIS — R262 Difficulty in walking, not elsewhere classified: Secondary | ICD-10-CM | POA: Diagnosis not present

## 2016-04-05 DIAGNOSIS — R262 Difficulty in walking, not elsewhere classified: Secondary | ICD-10-CM | POA: Diagnosis not present

## 2016-04-05 DIAGNOSIS — R531 Weakness: Secondary | ICD-10-CM | POA: Diagnosis not present

## 2016-04-05 DIAGNOSIS — M25562 Pain in left knee: Secondary | ICD-10-CM | POA: Diagnosis not present

## 2016-04-05 DIAGNOSIS — M25662 Stiffness of left knee, not elsewhere classified: Secondary | ICD-10-CM | POA: Diagnosis not present

## 2016-04-10 DIAGNOSIS — R262 Difficulty in walking, not elsewhere classified: Secondary | ICD-10-CM | POA: Diagnosis not present

## 2016-04-10 DIAGNOSIS — M25562 Pain in left knee: Secondary | ICD-10-CM | POA: Diagnosis not present

## 2016-04-10 DIAGNOSIS — M25662 Stiffness of left knee, not elsewhere classified: Secondary | ICD-10-CM | POA: Diagnosis not present

## 2016-04-10 DIAGNOSIS — R531 Weakness: Secondary | ICD-10-CM | POA: Diagnosis not present

## 2016-04-11 DIAGNOSIS — G4733 Obstructive sleep apnea (adult) (pediatric): Secondary | ICD-10-CM | POA: Diagnosis not present

## 2016-04-12 DIAGNOSIS — M25662 Stiffness of left knee, not elsewhere classified: Secondary | ICD-10-CM | POA: Diagnosis not present

## 2016-04-12 DIAGNOSIS — R262 Difficulty in walking, not elsewhere classified: Secondary | ICD-10-CM | POA: Diagnosis not present

## 2016-04-12 DIAGNOSIS — R531 Weakness: Secondary | ICD-10-CM | POA: Diagnosis not present

## 2016-04-12 DIAGNOSIS — M25562 Pain in left knee: Secondary | ICD-10-CM | POA: Diagnosis not present

## 2016-05-01 DIAGNOSIS — M25562 Pain in left knee: Secondary | ICD-10-CM | POA: Diagnosis not present

## 2016-05-01 DIAGNOSIS — R531 Weakness: Secondary | ICD-10-CM | POA: Diagnosis not present

## 2016-05-01 DIAGNOSIS — R262 Difficulty in walking, not elsewhere classified: Secondary | ICD-10-CM | POA: Diagnosis not present

## 2016-05-01 DIAGNOSIS — M25662 Stiffness of left knee, not elsewhere classified: Secondary | ICD-10-CM | POA: Diagnosis not present

## 2016-05-02 DIAGNOSIS — H2513 Age-related nuclear cataract, bilateral: Secondary | ICD-10-CM | POA: Diagnosis not present

## 2016-05-02 DIAGNOSIS — H5213 Myopia, bilateral: Secondary | ICD-10-CM | POA: Diagnosis not present

## 2016-05-29 DIAGNOSIS — H2511 Age-related nuclear cataract, right eye: Secondary | ICD-10-CM | POA: Diagnosis not present

## 2016-05-29 DIAGNOSIS — H25811 Combined forms of age-related cataract, right eye: Secondary | ICD-10-CM | POA: Diagnosis not present

## 2016-06-07 DIAGNOSIS — R197 Diarrhea, unspecified: Secondary | ICD-10-CM | POA: Diagnosis not present

## 2016-06-08 DIAGNOSIS — R197 Diarrhea, unspecified: Secondary | ICD-10-CM | POA: Diagnosis not present

## 2016-09-05 DIAGNOSIS — G4733 Obstructive sleep apnea (adult) (pediatric): Secondary | ICD-10-CM | POA: Diagnosis not present

## 2016-09-05 DIAGNOSIS — R400 Somnolence: Secondary | ICD-10-CM | POA: Diagnosis not present

## 2016-09-10 DIAGNOSIS — H2512 Age-related nuclear cataract, left eye: Secondary | ICD-10-CM | POA: Diagnosis not present

## 2016-09-10 DIAGNOSIS — H25012 Cortical age-related cataract, left eye: Secondary | ICD-10-CM | POA: Diagnosis not present

## 2016-10-16 DIAGNOSIS — H25812 Combined forms of age-related cataract, left eye: Secondary | ICD-10-CM | POA: Diagnosis not present

## 2016-10-16 DIAGNOSIS — H25012 Cortical age-related cataract, left eye: Secondary | ICD-10-CM | POA: Diagnosis not present

## 2016-10-16 DIAGNOSIS — H2512 Age-related nuclear cataract, left eye: Secondary | ICD-10-CM | POA: Diagnosis not present

## 2016-10-19 DIAGNOSIS — Z85828 Personal history of other malignant neoplasm of skin: Secondary | ICD-10-CM | POA: Diagnosis not present

## 2016-10-19 DIAGNOSIS — L812 Freckles: Secondary | ICD-10-CM | POA: Diagnosis not present

## 2016-10-19 DIAGNOSIS — D2271 Melanocytic nevi of right lower limb, including hip: Secondary | ICD-10-CM | POA: Diagnosis not present

## 2016-10-19 DIAGNOSIS — D225 Melanocytic nevi of trunk: Secondary | ICD-10-CM | POA: Diagnosis not present

## 2016-10-19 DIAGNOSIS — D1801 Hemangioma of skin and subcutaneous tissue: Secondary | ICD-10-CM | POA: Diagnosis not present

## 2016-10-19 DIAGNOSIS — L821 Other seborrheic keratosis: Secondary | ICD-10-CM | POA: Diagnosis not present

## 2016-10-19 DIAGNOSIS — L570 Actinic keratosis: Secondary | ICD-10-CM | POA: Diagnosis not present

## 2016-12-20 DIAGNOSIS — Z6828 Body mass index (BMI) 28.0-28.9, adult: Secondary | ICD-10-CM | POA: Diagnosis not present

## 2016-12-20 DIAGNOSIS — M79642 Pain in left hand: Secondary | ICD-10-CM | POA: Diagnosis not present

## 2016-12-20 DIAGNOSIS — I10 Essential (primary) hypertension: Secondary | ICD-10-CM | POA: Diagnosis not present

## 2016-12-20 DIAGNOSIS — Z23 Encounter for immunization: Secondary | ICD-10-CM | POA: Diagnosis not present

## 2016-12-20 DIAGNOSIS — R011 Cardiac murmur, unspecified: Secondary | ICD-10-CM | POA: Diagnosis not present

## 2016-12-20 DIAGNOSIS — N528 Other male erectile dysfunction: Secondary | ICD-10-CM | POA: Diagnosis not present

## 2016-12-20 DIAGNOSIS — M1990 Unspecified osteoarthritis, unspecified site: Secondary | ICD-10-CM | POA: Diagnosis not present

## 2016-12-20 DIAGNOSIS — M79641 Pain in right hand: Secondary | ICD-10-CM | POA: Diagnosis not present

## 2016-12-20 DIAGNOSIS — Z1389 Encounter for screening for other disorder: Secondary | ICD-10-CM | POA: Diagnosis not present

## 2016-12-20 DIAGNOSIS — E7849 Other hyperlipidemia: Secondary | ICD-10-CM | POA: Diagnosis not present

## 2016-12-21 DIAGNOSIS — E7849 Other hyperlipidemia: Secondary | ICD-10-CM | POA: Diagnosis not present

## 2016-12-21 DIAGNOSIS — M1990 Unspecified osteoarthritis, unspecified site: Secondary | ICD-10-CM | POA: Diagnosis not present

## 2016-12-21 DIAGNOSIS — Z125 Encounter for screening for malignant neoplasm of prostate: Secondary | ICD-10-CM | POA: Diagnosis not present

## 2016-12-21 DIAGNOSIS — R82998 Other abnormal findings in urine: Secondary | ICD-10-CM | POA: Diagnosis not present

## 2016-12-21 DIAGNOSIS — I10 Essential (primary) hypertension: Secondary | ICD-10-CM | POA: Diagnosis not present

## 2016-12-25 DIAGNOSIS — M79641 Pain in right hand: Secondary | ICD-10-CM | POA: Diagnosis not present

## 2016-12-25 DIAGNOSIS — M542 Cervicalgia: Secondary | ICD-10-CM | POA: Diagnosis not present

## 2016-12-25 DIAGNOSIS — M79642 Pain in left hand: Secondary | ICD-10-CM | POA: Diagnosis not present

## 2016-12-25 DIAGNOSIS — G5603 Carpal tunnel syndrome, bilateral upper limbs: Secondary | ICD-10-CM | POA: Diagnosis not present

## 2016-12-25 DIAGNOSIS — M5412 Radiculopathy, cervical region: Secondary | ICD-10-CM | POA: Diagnosis not present

## 2017-01-01 DIAGNOSIS — G5603 Carpal tunnel syndrome, bilateral upper limbs: Secondary | ICD-10-CM | POA: Diagnosis not present

## 2017-01-01 DIAGNOSIS — M5412 Radiculopathy, cervical region: Secondary | ICD-10-CM | POA: Diagnosis not present

## 2017-01-01 DIAGNOSIS — G8929 Other chronic pain: Secondary | ICD-10-CM | POA: Diagnosis not present

## 2017-01-02 ENCOUNTER — Other Ambulatory Visit: Payer: Self-pay | Admitting: Orthopedic Surgery

## 2017-01-02 DIAGNOSIS — G5603 Carpal tunnel syndrome, bilateral upper limbs: Secondary | ICD-10-CM | POA: Diagnosis not present

## 2017-01-02 DIAGNOSIS — M5412 Radiculopathy, cervical region: Secondary | ICD-10-CM | POA: Diagnosis not present

## 2017-01-08 ENCOUNTER — Other Ambulatory Visit: Payer: Self-pay

## 2017-01-08 ENCOUNTER — Encounter (HOSPITAL_BASED_OUTPATIENT_CLINIC_OR_DEPARTMENT_OTHER): Payer: Self-pay | Admitting: *Deleted

## 2017-01-14 ENCOUNTER — Encounter (HOSPITAL_BASED_OUTPATIENT_CLINIC_OR_DEPARTMENT_OTHER)
Admission: RE | Admit: 2017-01-14 | Discharge: 2017-01-14 | Disposition: A | Discharge status: Home / Self Care | Payer: Medicare HMO | Source: Ambulatory Visit | Attending: Orthopedic Surgery | Admitting: Orthopedic Surgery

## 2017-01-14 DIAGNOSIS — G4730 Sleep apnea, unspecified: Secondary | ICD-10-CM | POA: Diagnosis not present

## 2017-01-14 DIAGNOSIS — Z79899 Other long term (current) drug therapy: Secondary | ICD-10-CM | POA: Diagnosis not present

## 2017-01-14 DIAGNOSIS — I10 Essential (primary) hypertension: Secondary | ICD-10-CM | POA: Diagnosis not present

## 2017-01-14 DIAGNOSIS — G5601 Carpal tunnel syndrome, right upper limb: Secondary | ICD-10-CM | POA: Diagnosis not present

## 2017-01-14 DIAGNOSIS — M4186 Other forms of scoliosis, lumbar region: Secondary | ICD-10-CM | POA: Diagnosis not present

## 2017-01-14 DIAGNOSIS — E785 Hyperlipidemia, unspecified: Secondary | ICD-10-CM | POA: Diagnosis not present

## 2017-01-14 LAB — BASIC METABOLIC PANEL
Anion gap: 8 (ref 5–15)
BUN: 18 mg/dL (ref 6–20)
CO2: 27 mmol/L (ref 22–32)
CREATININE: 1.06 mg/dL (ref 0.61–1.24)
Calcium: 9.4 mg/dL (ref 8.9–10.3)
Chloride: 102 mmol/L (ref 101–111)
GFR calc Af Amer: >60 mL/min (ref >60)
Glucose, Bld: 90 mg/dL (ref 65–99)
POTASSIUM: 4.5 mmol/L (ref 3.5–5.1)
SODIUM: 137 mmol/L (ref 135–145)

## 2017-01-15 ENCOUNTER — Encounter (HOSPITAL_BASED_OUTPATIENT_CLINIC_OR_DEPARTMENT_OTHER): Payer: Self-pay | Admitting: *Deleted

## 2017-01-15 ENCOUNTER — Other Ambulatory Visit: Payer: Self-pay

## 2017-01-15 ENCOUNTER — Ambulatory Visit (HOSPITAL_BASED_OUTPATIENT_CLINIC_OR_DEPARTMENT_OTHER): Payer: Medicare HMO | Admitting: Anesthesiology

## 2017-01-15 ENCOUNTER — Ambulatory Visit (HOSPITAL_BASED_OUTPATIENT_CLINIC_OR_DEPARTMENT_OTHER)
Admission: RE | Admit: 2017-01-15 | Discharge: 2017-01-15 | Disposition: A | Discharge status: Home / Self Care | Payer: Medicare HMO | Source: Ambulatory Visit | Attending: Orthopedic Surgery | Admitting: Orthopedic Surgery

## 2017-01-15 ENCOUNTER — Encounter (HOSPITAL_BASED_OUTPATIENT_CLINIC_OR_DEPARTMENT_OTHER)
Admission: RE | Disposition: A | Discharge status: Home / Self Care | Payer: Self-pay | Source: Ambulatory Visit | Attending: Orthopedic Surgery

## 2017-01-15 DIAGNOSIS — Z79899 Other long term (current) drug therapy: Secondary | ICD-10-CM | POA: Diagnosis not present

## 2017-01-15 DIAGNOSIS — I10 Essential (primary) hypertension: Secondary | ICD-10-CM | POA: Diagnosis not present

## 2017-01-15 DIAGNOSIS — M4186 Other forms of scoliosis, lumbar region: Secondary | ICD-10-CM | POA: Diagnosis not present

## 2017-01-15 DIAGNOSIS — G4730 Sleep apnea, unspecified: Secondary | ICD-10-CM | POA: Insufficient documentation

## 2017-01-15 DIAGNOSIS — G5601 Carpal tunnel syndrome, right upper limb: Secondary | ICD-10-CM | POA: Insufficient documentation

## 2017-01-15 DIAGNOSIS — E785 Hyperlipidemia, unspecified: Secondary | ICD-10-CM | POA: Insufficient documentation

## 2017-01-15 HISTORY — PX: CARPAL TUNNEL RELEASE: SHX101

## 2017-01-15 SURGERY — CARPAL TUNNEL RELEASE
Anesthesia: Regional | Site: Wrist | Laterality: Right

## 2017-01-15 MED ORDER — 0.9 % SODIUM CHLORIDE (POUR BTL) OPTIME
TOPICAL | Status: DC | PRN
  Administered 2017-01-15: 200 mL

## 2017-01-15 MED ORDER — FENTANYL CITRATE (PF) 100 MCG/2ML IJ SOLN
INTRAMUSCULAR | Status: AC
  Filled 2017-01-15: qty 2

## 2017-01-15 MED ORDER — PROPOFOL 10 MG/ML IV BOLUS
INTRAVENOUS | Status: AC
  Filled 2017-01-15: qty 20

## 2017-01-15 MED ORDER — CEFAZOLIN SODIUM-DEXTROSE 2-4 GM/100ML-% IV SOLN
INTRAVENOUS | Status: AC
  Filled 2017-01-15: qty 100

## 2017-01-15 MED ORDER — LIDOCAINE HCL (CARDIAC) 20 MG/ML IV SOLN
INTRAVENOUS | Status: DC | PRN
  Administered 2017-01-15: 30 mg via INTRAVENOUS

## 2017-01-15 MED ORDER — CHLORHEXIDINE GLUCONATE 4 % EX LIQD: 60.0000 mL | Freq: Once | CUTANEOUS | Status: DC

## 2017-01-15 MED ORDER — ONDANSETRON HCL 4 MG/2ML IJ SOLN
INTRAMUSCULAR | Status: DC | PRN
  Administered 2017-01-15: 4 mg via INTRAVENOUS

## 2017-01-15 MED ORDER — LACTATED RINGERS IV SOLN
INTRAVENOUS | Status: DC
  Administered 2017-01-15 (×2): via INTRAVENOUS

## 2017-01-15 MED ORDER — FENTANYL CITRATE (PF) 100 MCG/2ML IJ SOLN
25.0000 ug | INTRAMUSCULAR | Status: DC | PRN
Start: 2017-01-15 — End: 2017-01-15

## 2017-01-15 MED ORDER — HYDROCODONE-ACETAMINOPHEN 5-325 MG PO TABS: ORAL_TABLET | ORAL | 0 refills | Status: DC

## 2017-01-15 MED ORDER — SCOPOLAMINE 1 MG/3DAYS TD PT72: 1.0000 | MEDICATED_PATCH | Freq: Once | TRANSDERMAL | Status: DC | PRN

## 2017-01-15 MED ORDER — CEFAZOLIN SODIUM-DEXTROSE 2-4 GM/100ML-% IV SOLN
2.0000 g | INTRAVENOUS | Status: AC
  Administered 2017-01-15: 2 g via INTRAVENOUS

## 2017-01-15 MED ORDER — PROPOFOL 10 MG/ML IV BOLUS
INTRAVENOUS | Status: DC | PRN
  Administered 2017-01-15 (×2): 20 mg via INTRAVENOUS
  Administered 2017-01-15: 10 mg via INTRAVENOUS

## 2017-01-15 MED ORDER — ONDANSETRON HCL 4 MG/2ML IJ SOLN: 4.0000 mg | Freq: Once | INTRAMUSCULAR | Status: DC | PRN

## 2017-01-15 MED ORDER — MIDAZOLAM HCL 2 MG/2ML IJ SOLN
INTRAMUSCULAR | Status: AC
  Filled 2017-01-15: qty 2

## 2017-01-15 MED ORDER — BUPIVACAINE HCL (PF) 0.25 % IJ SOLN
INTRAMUSCULAR | Status: DC | PRN
  Administered 2017-01-15: 10 mL

## 2017-01-15 MED ORDER — MIDAZOLAM HCL 2 MG/2ML IJ SOLN
1.0000 mg | INTRAMUSCULAR | Status: DC | PRN
  Administered 2017-01-15 (×2): 1 mg via INTRAVENOUS

## 2017-01-15 MED ORDER — FENTANYL CITRATE (PF) 100 MCG/2ML IJ SOLN
50.0000 ug | INTRAMUSCULAR | Status: DC | PRN
  Administered 2017-01-15 (×2): 50 ug via INTRAVENOUS

## 2017-01-15 MED ORDER — BUPIVACAINE HCL (PF) 0.25 % IJ SOLN
INTRAMUSCULAR | Status: AC
  Filled 2017-01-15: qty 30

## 2017-01-15 SURGICAL SUPPLY — 37 items
BANDAGE ACE 3X5.8 VEL STRL LF (GAUZE/BANDAGES/DRESSINGS) ×2 IMPLANT
BLADE SURG 15 STRL LF DISP TIS (BLADE) ×2 IMPLANT
BLADE SURG 15 STRL SS (BLADE) ×2
BNDG ESMARK 4X9 LF (GAUZE/BANDAGES/DRESSINGS) ×2 IMPLANT
BNDG GAUZE ELAST 4 BULKY (GAUZE/BANDAGES/DRESSINGS) ×2 IMPLANT
CHLORAPREP W/TINT 26ML (MISCELLANEOUS) ×2 IMPLANT
CORD BIPOLAR FORCEPS 12FT (ELECTRODE) ×2 IMPLANT
COVER BACK TABLE 60X90IN (DRAPES) ×2 IMPLANT
COVER MAYO STAND STRL (DRAPES) ×2 IMPLANT
CUFF TOURNIQUET SINGLE 18IN (TOURNIQUET CUFF) ×2 IMPLANT
DRAPE EXTREMITY T 121X128X90 (DRAPE) ×2 IMPLANT
DRAPE SURG 17X23 STRL (DRAPES) ×2 IMPLANT
DRSG PAD ABDOMINAL 8X10 ST (GAUZE/BANDAGES/DRESSINGS) ×2 IMPLANT
GAUZE SPONGE 4X4 12PLY STRL (GAUZE/BANDAGES/DRESSINGS) ×2 IMPLANT
GAUZE XEROFORM 1X8 LF (GAUZE/BANDAGES/DRESSINGS) ×2 IMPLANT
GLOVE BIO SURGEON STRL SZ7.5 (GLOVE) ×2 IMPLANT
GLOVE BIOGEL PI IND STRL 7.0 (GLOVE) ×1 IMPLANT
GLOVE BIOGEL PI IND STRL 7.5 (GLOVE) ×1 IMPLANT
GLOVE BIOGEL PI IND STRL 8 (GLOVE) ×1 IMPLANT
GLOVE BIOGEL PI INDICATOR 7.0 (GLOVE) ×1
GLOVE BIOGEL PI INDICATOR 7.5 (GLOVE) ×1
GLOVE BIOGEL PI INDICATOR 8 (GLOVE) ×1
GLOVE SURG SS PI 6.5 STRL IVOR (GLOVE) ×2 IMPLANT
GOWN STRL REUS W/ TWL LRG LVL3 (GOWN DISPOSABLE) ×1 IMPLANT
GOWN STRL REUS W/TWL LRG LVL3 (GOWN DISPOSABLE) ×1
GOWN STRL REUS W/TWL XL LVL3 (GOWN DISPOSABLE) ×2 IMPLANT
NEEDLE HYPO 25X1 1.5 SAFETY (NEEDLE) ×2 IMPLANT
NS IRRIG 1000ML POUR BTL (IV SOLUTION) ×2 IMPLANT
PACK BASIN DAY SURGERY FS (CUSTOM PROCEDURE TRAY) ×2 IMPLANT
PADDING CAST ABS 4INX4YD NS (CAST SUPPLIES) ×1
PADDING CAST ABS COTTON 4X4 ST (CAST SUPPLIES) ×1 IMPLANT
STOCKINETTE 4X48 STRL (DRAPES) ×2 IMPLANT
SUT ETHILON 4 0 PS 2 18 (SUTURE) ×2 IMPLANT
SYR BULB 3OZ (MISCELLANEOUS) ×2 IMPLANT
SYR CONTROL 10ML LL (SYRINGE) ×2 IMPLANT
TOWEL OR 17X24 6PK STRL BLUE (TOWEL DISPOSABLE) ×4 IMPLANT
UNDERPAD 30X30 (UNDERPADS AND DIAPERS) ×2 IMPLANT

## 2017-01-15 NOTE — Discharge Instructions (Addendum)

## 2017-01-15 NOTE — Op Note (Signed)
01/15/2017 [facility]                              OPERATIVE REPORT   PREOPERATIVE DIAGNOSIS:  Right carpal tunnel syndrome.  POSTOPERATIVE DIAGNOSIS:  Right carpal tunnel syndrome.  PROCEDURE:  Right carpal tunnel release.  SURGEON:  Leanora Cover, MD  ASSISTANT:  none.  ANESTHESIA:  Bier block and sedation.  IV FLUIDS:  Per anesthesia flow sheet.  ESTIMATED BLOOD LOSS:  Minimal.  COMPLICATIONS:  None.  SPECIMENS:  None.  TOURNIQUET TIME:    Total Tourniquet Time Documented: Forearm (Right) - 32 minutes Total: Forearm (Right) - 32 minutes   DISPOSITION:  Stable to PACU.  LOCATION: [facility]  INDICATIONS:  71 yo male with numbness and tingling in right hand.  Positive nerve conduction studies. He wishes to have a carpal tunnel release for management of his symptoms.  Risks, benefits and alternatives of surgery were discussed including the risk of blood loss; infection; damage to nerves, vessels, tendons, ligaments, bone; failure of surgery; need for additional surgery; complications with wound healing; continued pain; recurrence of carpal tunnel syndrome; and damage to motor branch. He voiced understanding of these risks and elected to proceed.   OPERATIVE COURSE:  After being identified preoperatively by myself, the patient and I agreed upon the procedure and site of procedure.  The surgical site was marked.  The risks, benefits, and alternatives of the surgery were reviewed and he wished to proceed.  Surgical consent had been signed.  He was given IV Ancef as preoperative antibiotic prophylaxis.  He was transferred to the operating room and placed on the operating room table in supine position with the Right upper extremity on an armboard.  Bier block and sedation was induced by Anesthesiology.  Right upper extremity was prepped and draped in normal sterile orthopaedic fashion.  A surgical pause was performed between the surgeons, anesthesia, and  operating room staff, and all were in agreement as to the patient, procedure, and site of procedure.  Tourniquet at the proximal aspect of the forearm had been inflated for the Bier block.   Incision was made over the transverse carpal ligament and carried into the subcutaneous tissues by spreading technique.  Bipolar electrocautery was used to obtain hemostasis.  The palmar fascia was sharply incised.  The transverse carpal ligament was identified and sharply incised.  It was incised distally first.  Care was taken to ensure complete decompression distally.  It was then incised proximally.  Scissors were used to split the distal aspect of the volar antebrachial fascia.  A finger was placed into the wound to ensure complete decompression, which was the case.  The nerve was examined.  It was hyperemic with and hourglass deformity.  The motor branch was identified and was intact.  The wound was copiously irrigated with sterile saline.  It was then closed with 4-0 nylon in a horizontal mattress fashion.  It was injected with 0.25% plain Marcaine to aid in postoperative analgesia.  It was dressed with sterile Xeroform, 4x4s, an ABD, and wrapped with Kerlix and an Ace bandage.  Tourniquet was deflated at 32 minutes.  Fingertips were pink with brisk capillary refill after deflation of the tourniquet.  Operative drapes were broken down.  The patient was awoken from anesthesia safely.  He was transferred back to stretcher and taken to the PACU in stable condition.  I will see him back in  the office in 1 week for postoperative followup.  I will give him a prescription for norco 5/325 1-2 tabs PO q6 hours prn pain, dispense #20.    Tennis Must, MD Electronically signed, 01/15/17

## 2017-01-15 NOTE — Anesthesia Preprocedure Evaluation (Addendum)
Anesthesia Evaluation  Patient identified by MRN, date of birth, ID band Patient awake    Reviewed: Allergy & Precautions, NPO status , Patient's Chart, lab work & pertinent test results  Airway Mallampati: II  TM Distance: >3 FB Neck ROM: Full    Dental no notable dental hx. (+) Caps   Pulmonary sleep apnea and Continuous Positive Airway Pressure Ventilation ,    Pulmonary exam normal breath sounds clear to auscultation       Cardiovascular hypertension, Pt. on medications Normal cardiovascular exam Rhythm:Regular Rate:Normal     Neuro/Psych negative neurological ROS  negative psych ROS   GI/Hepatic negative GI ROS, Neg liver ROS,   Endo/Other  Hyperlipidemia  Renal/GU negative Renal ROS  negative genitourinary   Musculoskeletal  (+) Arthritis , Osteoarthritis,  OA left knee Scoliosis Lumbar spine   Abdominal   Peds  Hematology negative hematology ROS (+)   Anesthesia Other Findings HLD  Reproductive/Obstetrics                              Chemistry      Component Value Date/Time   NA 137 01/14/2017 1310   K 4.5 01/14/2017 1310   CL 102 01/14/2017 1310   CO2 27 01/14/2017 1310   BUN 18 01/14/2017 1310   CREATININE 1.06 01/14/2017 1310   CREATININE 1.10 10/14/2014 0732      Component Value Date/Time   CALCIUM 9.4 01/14/2017 1310   ALKPHOS 43 02/24/2016 1144   AST 26 02/24/2016 1144   ALT 24 02/24/2016 1144   BILITOT 0.7 02/24/2016 1144     Lab Results  Component Value Date   WBC 15.9 (H) 02/28/2016   HGB 13.1 02/28/2016   HCT 38.4 (L) 02/28/2016   MCV 88.7 02/28/2016   PLT 201 02/28/2016   EKG:  Anesthesia Physical  Anesthesia Plan  ASA: III  Anesthesia Plan: Bier Block and Bier Block-LIDOCAINE ONLY   Post-op Pain Management:    Induction: Intravenous  PONV Risk Score and Plan: 1 and Propofol infusion and Treatment may vary due to age or medical  condition  Airway Management Planned: Natural Airway  Additional Equipment:   Intra-op Plan:   Post-operative Plan:   Informed Consent: I have reviewed the patients History and Physical, chart, labs and discussed the procedure including the risks, benefits and alternatives for the proposed anesthesia with the patient or authorized representative who has indicated his/her understanding and acceptance.   Dental advisory given  Plan Discussed with: CRNA  Anesthesia Plan Comments:         Anesthesia Quick Evaluation

## 2017-01-15 NOTE — Brief Op Note (Signed)
01/15/2017  2:08 PM  PATIENT:  [facility]  70 y.o. male  PRE-OPERATIVE DIAGNOSIS:  RIGHT CARPAL TUNNEL SYNDROME  POST-OPERATIVE DIAGNOSIS:  RIGHT CARPAL TUNNEL SYNDROME  PROCEDURE:  Procedure(s): RIGHT CARPAL TUNNEL RELEASE (Right)  SURGEON:  Surgeon(s) and Role:    * Leanora Cover, MD - Primary  PHYSICIAN ASSISTANT:   ASSISTANTS: none   ANESTHESIA:   Bier block with sedation  EBL:  Minimal   BLOOD ADMINISTERED:none  DRAINS: none   LOCAL MEDICATIONS USED:  MARCAINE     SPECIMEN:  No Specimen  DISPOSITION OF SPECIMEN:  N/A  COUNTS:  YES  TOURNIQUET:   Total Tourniquet Time Documented: Forearm (Right) - 32 minutes Total: Forearm (Right) - 32 minutes   DICTATION: .Note written in EPIC  PLAN OF CARE: Discharge to home after PACU  PATIENT DISPOSITION:  PACU - hemodynamically stable.

## 2017-01-15 NOTE — H&P (Signed)
Kevin Ramirez is an 71 y.o. male.   Chief Complaint: right carpal tunnel syndrome HPI: 71 yo male with numbness and tingling in bilateral hands.  Positive nerve conduction studies.  He wishes to have righ carpal tunnel release for management of symptoms.  Allergies:  Allergies  Allergen Reactions  . No Known Allergies     Past Medical History:  Diagnosis Date  . Arthritis   . Hypertension   . Sleep apnea    cpap nightly    Past Surgical History:  Procedure Laterality Date  . arthroscopic surgery Left knee yrs ago   x 2  . COLONOSCOPY WITH PROPOFOL N/A 07/28/2013   Procedure: COLONOSCOPY WITH PROPOFOL;  Surgeon: Garlan Fair, MD;  Location: [facility];  Service: Endoscopy;  Laterality: N/A;  . left knee     arothroscopy  . surgery to chest  age 64   to bring chest wall in -"pigeon breasted"  . TOTAL KNEE ARTHROPLASTY Left 02/27/2016   Procedure: TOTAL KNEE ARTHROPLASTY;  Surgeon: Vickey Huger, MD;  Location: [facility];  Service: Orthopedics;  Laterality: Left;    Family History: History reviewed. No pertinent family history.  Social History:   reports that  has never smoked. he has never used smokeless tobacco. He reports that he drinks alcohol. He reports that he does not use drugs.  Medications: Medications Prior to Admission  Medication Sig Dispense Refill  . cholecalciferol (VITAMIN D) 1000 units tablet Take 1,000 Units by mouth daily.    . hydrochlorothiazide (HYDRODIURIL) 25 MG tablet Take 25 mg by mouth every morning.    . Multiple Vitamin (MULTIVITAMIN WITH MINERALS) TABS tablet Take 1 tablet by mouth daily.    . simvastatin (ZOCOR) 20 MG tablet Take 20 mg by mouth every morning.    . zolpidem (AMBIEN) 10 MG tablet Take 1 tablet by mouth at bedtime as needed.  5    Results for orders placed or performed during the hospital encounter of 01/15/17 (from the past 48 hour(s))  Basic metabolic panel     Status: None   Collection Time: 01/14/17  1:10 PM  Result  Value Ref Range   Sodium 137 135 - 145 mmol/L   Potassium 4.5 3.5 - 5.1 mmol/L   Chloride 102 101 - 111 mmol/L   CO2 27 22 - 32 mmol/L   Glucose, Bld 90 65 - 99 mg/dL   BUN 18 6 - 20 mg/dL   Creatinine, Ser 1.06 0.61 - 1.24 mg/dL   Calcium 9.4 8.9 - 10.3 mg/dL   GFR calc non Af Amer >60 >60 mL/min   GFR calc Af Amer >60 >60 mL/min    Comment: (NOTE) The eGFR has been calculated using the CKD EPI equation. This calculation has not been validated in all clinical situations. eGFR's persistently <60 mL/min signify possible Chronic Kidney Disease.    Anion gap 8 5 - 15    No results found.   A comprehensive review of systems was negative except for: Neurological: positive for paresthesia  Height _0  (1.88 m), weight 95.3 kg (210 lb).  General appearance: alert, cooperative and appears stated age Head: Normocephalic, without obvious abnormality, atraumatic Neck: supple, symmetrical, trachea midline Resp: clear to auscultation bilaterally Cardio: regular rate and rhythm GI: non-tender Extremities: Intact sensation and capillary refill all digits except with decreased sensation in the thumb index and long fingers.  +epl/fpl/io.  No wounds.  Pulses: 2+ and symmetric Skin: Skin color, texture, turgor normal. No rashes or lesions  Neurologic: Grossly normal Incision/Wound:none  Assessment/Plan Right carpal tunnel syndrome.  Non operative and operative treatment options were discussed with the patient and patient wishes to proceed with operative treatment. Risks, benefits, and alternatives of surgery were discussed and the patient agrees with the plan of care.   Brallan Denio R 01/15/2017, 12:43 PM

## 2017-01-15 NOTE — Transfer of Care (Signed)
Immediate Anesthesia Transfer of Care Note  Patient: Kevin Ramirez  Procedure(s) Performed: RIGHT CARPAL TUNNEL RELEASE (Right Wrist)  Patient Location: PACU  Anesthesia Type:MAC and Bier block  Level of Consciousness: awake, alert  and oriented  Airway & Oxygen Therapy: Patient Spontanous Breathing and Patient connected to face mask oxygen  Post-op Assessment: Report given to RN and Post -op Vital signs reviewed and stable  Post vital signs: Reviewed and stable  Last Vitals:  Vitals:   01/15/17 1246  BP: (!) 144/87  Pulse: (!) 56  Resp: 18  Temp: 36.5 C  SpO2: 100%    Last Pain:  Vitals:   01/15/17 1246  TempSrc: Oral         Complications: No apparent anesthesia complications

## 2017-01-16 ENCOUNTER — Encounter (HOSPITAL_BASED_OUTPATIENT_CLINIC_OR_DEPARTMENT_OTHER): Payer: Self-pay | Admitting: Orthopedic Surgery

## 2017-01-16 NOTE — Anesthesia Postprocedure Evaluation (Signed)
Anesthesia Post Note  Patient: Kevin Ramirez  Procedure(s) Performed: RIGHT CARPAL TUNNEL RELEASE (Right Wrist)     Patient location during evaluation: PACU Anesthesia Type: Bier Block Level of consciousness: awake and alert Pain management: pain level controlled Vital Signs Assessment: post-procedure vital signs reviewed and stable Respiratory status: spontaneous breathing, nonlabored ventilation, respiratory function stable and patient connected to nasal cannula oxygen Cardiovascular status: stable and blood pressure returned to baseline Postop Assessment: no apparent nausea or vomiting Anesthetic complications: no    Last Vitals:  Vitals:   01/15/17 1418 01/15/17 1509  BP:  121/82  Pulse: (!) 57 (!) 51  Resp: 13 18  Temp:  36.5 C  SpO2: 100% 97%    Last Pain:  Vitals:   01/15/17 1509  TempSrc:   PainSc: 0-No pain                 Ryan P Ellender

## 2017-01-31 DIAGNOSIS — Z471 Aftercare following joint replacement surgery: Secondary | ICD-10-CM | POA: Diagnosis not present

## 2017-01-31 DIAGNOSIS — M25462 Effusion, left knee: Secondary | ICD-10-CM | POA: Diagnosis not present

## 2017-01-31 DIAGNOSIS — Z96652 Presence of left artificial knee joint: Secondary | ICD-10-CM | POA: Diagnosis not present

## 2017-02-20 DIAGNOSIS — M47812 Spondylosis without myelopathy or radiculopathy, cervical region: Secondary | ICD-10-CM | POA: Diagnosis not present

## 2017-02-28 ENCOUNTER — Other Ambulatory Visit: Payer: Self-pay | Admitting: Orthopedic Surgery

## 2017-03-14 ENCOUNTER — Encounter (HOSPITAL_BASED_OUTPATIENT_CLINIC_OR_DEPARTMENT_OTHER): Payer: Self-pay | Admitting: *Deleted

## 2017-03-18 ENCOUNTER — Encounter (HOSPITAL_BASED_OUTPATIENT_CLINIC_OR_DEPARTMENT_OTHER)
Admission: RE | Admit: 2017-03-18 | Discharge: 2017-03-18 | Disposition: A | Discharge status: Home / Self Care | Payer: Medicare HMO | Source: Ambulatory Visit | Attending: Orthopedic Surgery | Admitting: Orthopedic Surgery

## 2017-03-18 DIAGNOSIS — M1712 Unilateral primary osteoarthritis, left knee: Secondary | ICD-10-CM | POA: Diagnosis not present

## 2017-03-18 DIAGNOSIS — E785 Hyperlipidemia, unspecified: Secondary | ICD-10-CM | POA: Diagnosis not present

## 2017-03-18 DIAGNOSIS — G4730 Sleep apnea, unspecified: Secondary | ICD-10-CM | POA: Diagnosis not present

## 2017-03-18 DIAGNOSIS — Z9989 Dependence on other enabling machines and devices: Secondary | ICD-10-CM | POA: Diagnosis not present

## 2017-03-18 DIAGNOSIS — I10 Essential (primary) hypertension: Secondary | ICD-10-CM | POA: Diagnosis not present

## 2017-03-18 DIAGNOSIS — M4186 Other forms of scoliosis, lumbar region: Secondary | ICD-10-CM | POA: Diagnosis not present

## 2017-03-18 DIAGNOSIS — G5602 Carpal tunnel syndrome, left upper limb: Secondary | ICD-10-CM | POA: Diagnosis present

## 2017-03-18 DIAGNOSIS — Z79899 Other long term (current) drug therapy: Secondary | ICD-10-CM | POA: Diagnosis not present

## 2017-03-18 LAB — BASIC METABOLIC PANEL
ANION GAP: 12 (ref 5–15)
BUN: 17 mg/dL (ref 6–20)
CO2: 23 mmol/L (ref 22–32)
Calcium: 9.2 mg/dL (ref 8.9–10.3)
Chloride: 102 mmol/L (ref 101–111)
Creatinine, Ser: 1.09 mg/dL (ref 0.61–1.24)
GFR calc Af Amer: >60 mL/min (ref >60)
GLUCOSE: 106 mg/dL — AB (ref 65–99)
POTASSIUM: 4.2 mmol/L (ref 3.5–5.1)
Sodium: 137 mmol/L (ref 135–145)

## 2017-03-21 ENCOUNTER — Other Ambulatory Visit: Payer: Self-pay

## 2017-03-21 ENCOUNTER — Encounter (HOSPITAL_BASED_OUTPATIENT_CLINIC_OR_DEPARTMENT_OTHER)
Admission: RE | Disposition: A | Discharge status: Home / Self Care | Payer: Self-pay | Source: Ambulatory Visit | Attending: Orthopedic Surgery

## 2017-03-21 ENCOUNTER — Ambulatory Visit (HOSPITAL_BASED_OUTPATIENT_CLINIC_OR_DEPARTMENT_OTHER): Payer: Medicare HMO | Admitting: Anesthesiology

## 2017-03-21 ENCOUNTER — Ambulatory Visit (HOSPITAL_BASED_OUTPATIENT_CLINIC_OR_DEPARTMENT_OTHER)
Admission: RE | Admit: 2017-03-21 | Discharge: 2017-03-21 | Disposition: A | Discharge status: Home / Self Care | Payer: Medicare HMO | Source: Ambulatory Visit | Attending: Orthopedic Surgery | Admitting: Orthopedic Surgery

## 2017-03-21 ENCOUNTER — Encounter (HOSPITAL_BASED_OUTPATIENT_CLINIC_OR_DEPARTMENT_OTHER): Payer: Self-pay | Admitting: Anesthesiology

## 2017-03-21 DIAGNOSIS — I10 Essential (primary) hypertension: Secondary | ICD-10-CM | POA: Diagnosis not present

## 2017-03-21 DIAGNOSIS — G5602 Carpal tunnel syndrome, left upper limb: Secondary | ICD-10-CM | POA: Insufficient documentation

## 2017-03-21 DIAGNOSIS — Z9989 Dependence on other enabling machines and devices: Secondary | ICD-10-CM | POA: Insufficient documentation

## 2017-03-21 DIAGNOSIS — G4730 Sleep apnea, unspecified: Secondary | ICD-10-CM | POA: Diagnosis not present

## 2017-03-21 DIAGNOSIS — E785 Hyperlipidemia, unspecified: Secondary | ICD-10-CM | POA: Diagnosis not present

## 2017-03-21 DIAGNOSIS — M1712 Unilateral primary osteoarthritis, left knee: Secondary | ICD-10-CM | POA: Insufficient documentation

## 2017-03-21 DIAGNOSIS — M4186 Other forms of scoliosis, lumbar region: Secondary | ICD-10-CM | POA: Diagnosis not present

## 2017-03-21 DIAGNOSIS — Z79899 Other long term (current) drug therapy: Secondary | ICD-10-CM | POA: Diagnosis not present

## 2017-03-21 DIAGNOSIS — M6740 Ganglion, unspecified site: Secondary | ICD-10-CM | POA: Diagnosis not present

## 2017-03-21 HISTORY — PX: CARPAL TUNNEL RELEASE: SHX101

## 2017-03-21 SURGERY — CARPAL TUNNEL RELEASE
Anesthesia: Monitor Anesthesia Care | Site: Wrist | Laterality: Left

## 2017-03-21 MED ORDER — CEFAZOLIN SODIUM-DEXTROSE 2-4 GM/100ML-% IV SOLN
2.0000 g | INTRAVENOUS | Status: AC
  Administered 2017-03-21: 2 g via INTRAVENOUS

## 2017-03-21 MED ORDER — HYDROCODONE-ACETAMINOPHEN 5-325 MG PO TABS: ORAL_TABLET | ORAL | 0 refills | Status: DC

## 2017-03-21 MED ORDER — FENTANYL CITRATE (PF) 100 MCG/2ML IJ SOLN: 50.0000 ug | INTRAMUSCULAR | Status: DC | PRN

## 2017-03-21 MED ORDER — FENTANYL CITRATE (PF) 100 MCG/2ML IJ SOLN
INTRAMUSCULAR | Status: AC
  Filled 2017-03-21: qty 2

## 2017-03-21 MED ORDER — OXYCODONE HCL 5 MG PO TABS: 5.0000 mg | ORAL_TABLET | Freq: Once | ORAL | Status: DC | PRN

## 2017-03-21 MED ORDER — BUPIVACAINE HCL (PF) 0.25 % IJ SOLN
INTRAMUSCULAR | Status: DC | PRN
  Administered 2017-03-21: 10 mL

## 2017-03-21 MED ORDER — LACTATED RINGERS IV SOLN
INTRAVENOUS | Status: DC
  Administered 2017-03-21: 10 mL/h via INTRAVENOUS
  Administered 2017-03-21: 12:00:00 via INTRAVENOUS

## 2017-03-21 MED ORDER — PROPOFOL 500 MG/50ML IV EMUL
INTRAVENOUS | Status: DC | PRN
  Administered 2017-03-21: 25 ug/kg/min via INTRAVENOUS

## 2017-03-21 MED ORDER — ONDANSETRON HCL 4 MG/2ML IJ SOLN
INTRAMUSCULAR | Status: AC
  Filled 2017-03-21: qty 2

## 2017-03-21 MED ORDER — ACETAMINOPHEN 160 MG/5ML PO SOLN: 325.0000 mg | ORAL | Status: DC | PRN

## 2017-03-21 MED ORDER — FENTANYL CITRATE (PF) 100 MCG/2ML IJ SOLN
INTRAMUSCULAR | Status: DC | PRN
  Administered 2017-03-21: 100 ug via INTRAVENOUS

## 2017-03-21 MED ORDER — ACETAMINOPHEN 325 MG PO TABS: 325.0000 mg | ORAL_TABLET | ORAL | Status: DC | PRN

## 2017-03-21 MED ORDER — CHLORHEXIDINE GLUCONATE 4 % EX LIQD: 60.0000 mL | Freq: Once | CUTANEOUS | Status: DC

## 2017-03-21 MED ORDER — CEFAZOLIN SODIUM-DEXTROSE 2-4 GM/100ML-% IV SOLN
INTRAVENOUS | Status: AC
  Filled 2017-03-21: qty 100

## 2017-03-21 MED ORDER — MIDAZOLAM HCL 2 MG/2ML IJ SOLN: 1.0000 mg | INTRAMUSCULAR | Status: DC | PRN

## 2017-03-21 MED ORDER — OXYCODONE HCL 5 MG/5ML PO SOLN: 5.0000 mg | Freq: Once | ORAL | Status: DC | PRN

## 2017-03-21 MED ORDER — LIDOCAINE HCL (PF) 0.5 % IJ SOLN
INTRAMUSCULAR | Status: DC | PRN
  Administered 2017-03-21: 30 mL via INTRAVENOUS

## 2017-03-21 MED ORDER — SCOPOLAMINE 1 MG/3DAYS TD PT72: 1.0000 | MEDICATED_PATCH | Freq: Once | TRANSDERMAL | Status: DC | PRN

## 2017-03-21 MED ORDER — FENTANYL CITRATE (PF) 100 MCG/2ML IJ SOLN: 25.0000 ug | INTRAMUSCULAR | Status: DC | PRN

## 2017-03-21 MED ORDER — PROPOFOL 10 MG/ML IV BOLUS
INTRAVENOUS | Status: AC
  Filled 2017-03-21: qty 20

## 2017-03-21 MED ORDER — 0.9 % SODIUM CHLORIDE (POUR BTL) OPTIME
TOPICAL | Status: DC | PRN
  Administered 2017-03-21: 100 mL

## 2017-03-21 MED ORDER — MIDAZOLAM HCL 5 MG/5ML IJ SOLN: INTRAMUSCULAR | Status: DC | PRN

## 2017-03-21 MED ORDER — ONDANSETRON HCL 4 MG/2ML IJ SOLN
INTRAMUSCULAR | Status: DC | PRN
  Administered 2017-03-21: 4 mg via INTRAVENOUS

## 2017-03-21 SURGICAL SUPPLY — 36 items
BANDAGE ACE 3X5.8 VEL STRL LF (GAUZE/BANDAGES/DRESSINGS) ×2 IMPLANT
BLADE SURG 15 STRL LF DISP TIS (BLADE) ×2 IMPLANT
BLADE SURG 15 STRL SS (BLADE) ×2
BNDG ESMARK 4X9 LF (GAUZE/BANDAGES/DRESSINGS) ×2 IMPLANT
BNDG GAUZE ELAST 4 BULKY (GAUZE/BANDAGES/DRESSINGS) ×2 IMPLANT
CHLORAPREP W/TINT 26ML (MISCELLANEOUS) ×2 IMPLANT
CORD BIPOLAR FORCEPS 12FT (ELECTRODE) ×2 IMPLANT
COVER BACK TABLE 60X90IN (DRAPES) ×2 IMPLANT
COVER MAYO STAND STRL (DRAPES) ×2 IMPLANT
CUFF TOURNIQUET SINGLE 18IN (TOURNIQUET CUFF) ×2 IMPLANT
DRAPE EXTREMITY T 121X128X90 (DRAPE) ×2 IMPLANT
DRAPE SURG 17X23 STRL (DRAPES) ×2 IMPLANT
DRSG PAD ABDOMINAL 8X10 ST (GAUZE/BANDAGES/DRESSINGS) ×2 IMPLANT
GAUZE SPONGE 4X4 12PLY STRL (GAUZE/BANDAGES/DRESSINGS) ×2 IMPLANT
GAUZE XEROFORM 1X8 LF (GAUZE/BANDAGES/DRESSINGS) ×2 IMPLANT
GLOVE BIO SURGEON STRL SZ7.5 (GLOVE) ×2 IMPLANT
GLOVE BIOGEL M STRL SZ7.5 (GLOVE) ×2 IMPLANT
GLOVE BIOGEL PI IND STRL 7.0 (GLOVE) ×2 IMPLANT
GLOVE BIOGEL PI IND STRL 8 (GLOVE) ×2 IMPLANT
GLOVE BIOGEL PI INDICATOR 7.0 (GLOVE) ×2
GLOVE BIOGEL PI INDICATOR 8 (GLOVE) ×2
GLOVE SURG SS PI 7.0 STRL IVOR (GLOVE) ×2 IMPLANT
GOWN STRL REUS W/ TWL LRG LVL3 (GOWN DISPOSABLE) ×2 IMPLANT
GOWN STRL REUS W/TWL LRG LVL3 (GOWN DISPOSABLE) ×2
GOWN STRL REUS W/TWL XL LVL3 (GOWN DISPOSABLE) ×2 IMPLANT
NEEDLE HYPO 25X1 1.5 SAFETY (NEEDLE) ×2 IMPLANT
NS IRRIG 1000ML POUR BTL (IV SOLUTION) ×2 IMPLANT
PACK BASIN DAY SURGERY FS (CUSTOM PROCEDURE TRAY) ×2 IMPLANT
PADDING CAST ABS 4INX4YD NS (CAST SUPPLIES) ×1
PADDING CAST ABS COTTON 4X4 ST (CAST SUPPLIES) ×1 IMPLANT
STOCKINETTE 4X48 STRL (DRAPES) ×2 IMPLANT
SUT ETHILON 4 0 PS 2 18 (SUTURE) ×2 IMPLANT
SYR BULB 3OZ (MISCELLANEOUS) ×2 IMPLANT
SYR CONTROL 10ML LL (SYRINGE) ×2 IMPLANT
TOWEL OR 17X24 6PK STRL BLUE (TOWEL DISPOSABLE) ×4 IMPLANT
UNDERPAD 30X30 (UNDERPADS AND DIAPERS) ×2 IMPLANT

## 2017-03-21 NOTE — Anesthesia Procedure Notes (Signed)
Procedure Name: MAC Date/Time: 03/21/2017 11:47 AM Performed by: Marrianne Mood, CRNA Pre-anesthesia Checklist: Patient identified, Timeout performed, Emergency Drugs available, Suction available and Patient being monitored Patient Re-evaluated:Patient Re-evaluated prior to induction Oxygen Delivery Method: Simple face mask Preoxygenation: Pre-oxygenation with 100% oxygen

## 2017-03-21 NOTE — H&P (Signed)
Kevin Ramirez is an 72 y.o. male.   Chief Complaint: left carpal tunnel syndrome HPI: 72 yo male with numbness in left hand.  Positive nerve conduction studies.  Has done well with right carpal tunnel release.  He wishes to have left carpal tunnel release.  Allergies:  Allergies  Allergen Reactions  . No Known Allergies     Past Medical History:  Diagnosis Date  . Arthritis   . Hypertension   . Sleep apnea    cpap nightly    Past Surgical History:  Procedure Laterality Date  . arthroscopic surgery Left knee yrs ago   x 2  . CARPAL TUNNEL RELEASE Right 01/15/2017   Procedure: RIGHT CARPAL TUNNEL RELEASE;  Surgeon: Leanora Cover, MD;  Location: [facility];  Service: Orthopedics;  Laterality: Right;  . COLONOSCOPY WITH PROPOFOL N/A 07/28/2013   Procedure: COLONOSCOPY WITH PROPOFOL;  Surgeon: Garlan Fair, MD;  Location: [facility];  Service: Endoscopy;  Laterality: N/A;  . left knee     arothroscopy  . surgery to chest  age 22   to bring chest wall in -"pigeon breasted"  . TOTAL KNEE ARTHROPLASTY Left 02/27/2016   Procedure: TOTAL KNEE ARTHROPLASTY;  Surgeon: Vickey Huger, MD;  Location: [facility];  Service: Orthopedics;  Laterality: Left;    Family History: History reviewed. No pertinent family history.  Social History:   reports that  has never smoked. he has never used smokeless tobacco. He reports that he drinks alcohol. He reports that he does not use drugs.  Medications: Medications Prior to Admission  Medication Sig Dispense Refill  . cholecalciferol (VITAMIN D) 1000 units tablet Take 1,000 Units by mouth daily.    . hydrochlorothiazide (HYDRODIURIL) 25 MG tablet Take 25 mg by mouth every morning.    . Multiple Vitamin (MULTIVITAMIN WITH MINERALS) TABS tablet Take 1 tablet by mouth daily.    . simvastatin (ZOCOR) 20 MG tablet Take 20 mg by mouth every morning.    . zolpidem (AMBIEN) 10 MG tablet Take 1 tablet by mouth at bedtime as needed.  5  .  HYDROcodone-acetaminophen (NORCO) 5-325 MG tablet 1-2 tabs po q6 hours prn pain 20 tablet 0    No results found for this or any previous visit (from the past 48 hour(s)).  No results found.   A comprehensive review of systems was negative.  Height 6\' 3"  (1.905 m), weight 96.6 kg (213 lb).  General appearance: alert, cooperative and appears stated age Head: Normocephalic, without obvious abnormality, atraumatic Neck: supple, symmetrical, trachea midline Resp: clear to auscultation bilaterally Cardio: regular rate and rhythm GI: non-tender Extremities: Intact sensation and capillary refill all digits.  +epl/fpl/io.  No wounds.  Pulses: 2+ and symmetric Skin: Skin color, texture, turgor normal. No rashes or lesions Neurologic: Grossly normal Incision/Wound:none  Assessment/Plan Left carpal tunnel syndrome.  Non operative and operative treatment options were discussed with the patient and patient wishes to proceed with operative treatment. Risks, benefits, and alternatives of surgery were discussed and the patient agrees with the plan of care.   Rudolf Blizard R 03/21/2017, 9:47 AM

## 2017-03-21 NOTE — Anesthesia Procedure Notes (Signed)
Anesthesia Regional Block: Bier block (IV Regional)   Pre-Anesthetic Checklist: ,, timeout performed, Correct Patient, Correct Site, Correct Laterality, Correct Procedure, Correct Position, site marked, Risks and benefits discussed,  Surgical consent,  Pre-op evaluation,  At surgeon's request and post-op pain management  Laterality: Left  Prep: chloraprep        Procedures:,,,,,, Esmarch exsanguination,,  Narrative:  Start time: 03/21/2017 11:54 AM End time: 03/21/2017 11:57 AM  Events: blood aspirated,,,,,,,,,,

## 2017-03-21 NOTE — Anesthesia Postprocedure Evaluation (Signed)
Anesthesia Post Note  Patient: Kevin Ramirez  Procedure(s) Performed: LEFT CARPAL TUNNEL RELEASE (Left Wrist)     Patient location during evaluation: PACU Anesthesia Type: MAC and Bier Block Level of consciousness: awake and alert Pain management: pain level controlled Vital Signs Assessment: post-procedure vital signs reviewed and stable Respiratory status: spontaneous breathing, nonlabored ventilation, respiratory function stable and patient connected to nasal cannula oxygen Cardiovascular status: stable and blood pressure returned to baseline Postop Assessment: no apparent nausea or vomiting Anesthetic complications: no    Last Vitals:  Vitals:   03/21/17 1303 03/21/17 1320  BP:  126/84  Pulse: (!) 56 (!) 57  Resp: 15 16  Temp:  36.6 C  SpO2: 97% 96%    Last Pain:  Vitals:   03/21/17 1320  TempSrc: Oral  PainSc: 0-No pain                 Heavenlee Maiorana

## 2017-03-21 NOTE — Anesthesia Preprocedure Evaluation (Signed)
Anesthesia Evaluation  Patient identified by MRN, date of birth, ID band Patient awake    Reviewed: Allergy & Precautions, NPO status , Patient's Chart, lab work & pertinent test results  Airway Mallampati: II  TM Distance: >3 FB Neck ROM: Full    Dental no notable dental hx. (+) Caps   Pulmonary sleep apnea and Continuous Positive Airway Pressure Ventilation ,    Pulmonary exam normal breath sounds clear to auscultation       Cardiovascular hypertension, Pt. on medications Normal cardiovascular exam Rhythm:Regular Rate:Normal     Neuro/Psych negative neurological ROS  negative psych ROS   GI/Hepatic negative GI ROS, Neg liver ROS,   Endo/Other  Hyperlipidemia  Renal/GU negative Renal ROS  negative genitourinary   Musculoskeletal  (+) Arthritis , Osteoarthritis,  OA left knee Scoliosis Lumbar spine   Abdominal   Peds  Hematology negative hematology ROS (+)   Anesthesia Other Findings HLD  Reproductive/Obstetrics                             Anesthesia Physical Anesthesia Plan  ASA: II  Anesthesia Plan: MAC and Bier Block and Bier Block-LIDOCAINE ONLY   Post-op Pain Management:    Induction:   PONV Risk Score and Plan: 1 and Treatment may vary due to age or medical condition  Airway Management Planned: Nasal Cannula  Additional Equipment:   Intra-op Plan:   Post-operative Plan:   Informed Consent: I have reviewed the patients History and Physical, chart, labs and discussed the procedure including the risks, benefits and alternatives for the proposed anesthesia with the patient or authorized representative who has indicated his/her understanding and acceptance.   Dental advisory given  Plan Discussed with: CRNA and Surgeon  Anesthesia Plan Comments:         Anesthesia Quick Evaluation

## 2017-03-21 NOTE — Op Note (Signed)
03/21/2017 [facility]                              OPERATIVE REPORT   PREOPERATIVE DIAGNOSIS:  Left carpal tunnel syndrome.  POSTOPERATIVE DIAGNOSIS:  Left carpal tunnel syndrome.  PROCEDURE:  Left carpal tunnel release.  SURGEON:  Leanora Cover, MD  ASSISTANT:  none.  ANESTHESIA:  Bier block and sedation.  IV FLUIDS:  Per anesthesia flow sheet.  ESTIMATED BLOOD LOSS:  Minimal.  COMPLICATIONS:  None.  SPECIMENS:  None.  TOURNIQUET TIME:    Total Tourniquet Time Documented: Forearm (Left) - 25 minutes Total: Forearm (Left) - 25 minutes   DISPOSITION:  Stable to PACU.  LOCATION:  SURGERY CENTER  INDICATIONS:  72 yo male with numbness left hand.  Positive nerve conduction studies.   He wishes to have a carpal tunnel release for management of his symptoms.  Risks, benefits and alternatives of surgery were discussed including the risk of blood loss; infection; damage to nerves, vessels, tendons, ligaments, bone; failure of surgery; need for additional surgery; complications with wound healing; continued pain; recurrence of carpal tunnel syndrome; and damage to motor branch. He voiced understanding of these risks and elected to proceed.   OPERATIVE COURSE:  After being identified preoperatively by myself, the patient and I agreed upon the procedure and site of procedure.  The surgical site was marked.  The risks, benefits, and alternatives of the surgery were reviewed and he wished to proceed.  Surgical consent had been signed.  He was given IV Ancef as preoperative antibiotic prophylaxis.  He was transferred to the operating room and placed on the operating room table in supine position with the Left upper extremity on an armboard.  Bier block and sedation was induced by Anesthesiology.  Left upper extremity was prepped and draped in normal sterile orthopaedic fashion.  A surgical pause was performed between the surgeons, anesthesia, and operating room staff,  and all were in agreement as to the patient, procedure, and site of procedure.  Tourniquet at the proximal aspect of the forearm had been inflated for the Bier block.  Incision was made over the transverse carpal ligament and carried into the subcutaneous tissues by spreading technique.  Bipolar electrocautery was used to obtain hemostasis.  The palmar fascia was sharply incised.  The transverse carpal ligament was identified and sharply incised.  It was incised distally first.  Care was taken to ensure complete decompression distally.  It was then incised proximally.  Scissors were used to split the distal aspect of the volar antebrachial fascia.  A finger was placed into the wound to ensure complete decompression, which was the case.  The nerve was examined.  It was flattened and adherent to the radial leaflet.  The motor branch was identified and was intact.  The wound was copiously irrigated with sterile saline.  It was then closed with 4-0 nylon in a horizontal mattress fashion.  It was injected with 0.25% plain Marcaine to aid in postoperative analgesia.  It was dressed with sterile Xeroform, 4x4s, an ABD, and wrapped with Kerlix and an Ace bandage.  Tourniquet was deflated at 25 minutes.  Fingertips were pink with brisk capillary refill after deflation of the tourniquet.  Operative drapes were broken down.  The patient was awoken from anesthesia safely.  He was transferred back to stretcher and taken to the PACU in stable condition.  I will see him back in  the office in 1 week for postoperative followup.  I will give him a prescription for norco 5/325 1-2 tabs PO q6 hours prn pain, dispense #20.    Tennis Must, MD Electronically signed, 03/21/17

## 2017-03-21 NOTE — Brief Op Note (Signed)
03/21/2017  12:25 PM  PATIENT:  [name]  72 y.o. male  PRE-OPERATIVE DIAGNOSIS:  LEFT CARPAL TUNNEL SYNDROME G56.02  POST-OPERATIVE DIAGNOSIS:  LEFT CARPAL TUNNEL SYNDROME G56.02  PROCEDURE:  Procedure(s): LEFT CARPAL TUNNEL RELEASE (Left)  SURGEON:  Surgeon(s) and Role:    * Leanora Cover, MD - Primary  PHYSICIAN ASSISTANT:   ASSISTANTS: none   ANESTHESIA:   Bier block with sedation  EBL:  2 mL   BLOOD ADMINISTERED:none  DRAINS: none   LOCAL MEDICATIONS USED:  MARCAINE     SPECIMEN:  No Specimen  DISPOSITION OF SPECIMEN:  N/A  COUNTS:  YES  TOURNIQUET:   Total Tourniquet Time Documented: Forearm (Left) - 25 minutes Total: Forearm (Left) - 25 minutes   DICTATION: .Note written in EPIC  PLAN OF CARE: Discharge to home after PACU  PATIENT DISPOSITION:  PACU - hemodynamically stable.

## 2017-03-21 NOTE — Discharge Instructions (Addendum)

## 2017-03-21 NOTE — Transfer of Care (Signed)
Immediate Anesthesia Transfer of Care Note  Patient: Kevin Ramirez  Procedure(s) Performed: LEFT CARPAL TUNNEL RELEASE (Left Wrist)  Patient Location: PACU  Anesthesia Type:Bier block  Level of Consciousness: awake and patient cooperative  Airway & Oxygen Therapy: Patient Spontanous Breathing and Patient connected to face mask oxygen  Post-op Assessment: Report given to RN and Post -op Vital signs reviewed and stable  Post vital signs: Reviewed and stable  Last Vitals:  Vitals:   03/21/17 1053  BP: 138/76  Pulse: 60  Resp: 18  Temp: 36.6 C  SpO2: 98%    Last Pain:  Vitals:   03/21/17 1053  TempSrc: Oral         Complications: No apparent anesthesia complications

## 2017-03-25 ENCOUNTER — Encounter (HOSPITAL_BASED_OUTPATIENT_CLINIC_OR_DEPARTMENT_OTHER): Payer: Self-pay | Admitting: Orthopedic Surgery

## 2017-04-17 DIAGNOSIS — G4733 Obstructive sleep apnea (adult) (pediatric): Secondary | ICD-10-CM | POA: Diagnosis not present

## 2017-05-07 DIAGNOSIS — R69 Illness, unspecified: Secondary | ICD-10-CM | POA: Diagnosis not present

## 2017-05-24 DIAGNOSIS — R400 Somnolence: Secondary | ICD-10-CM | POA: Diagnosis not present

## 2017-05-24 DIAGNOSIS — G4733 Obstructive sleep apnea (adult) (pediatric): Secondary | ICD-10-CM | POA: Diagnosis not present

## 2017-08-15 DIAGNOSIS — L237 Allergic contact dermatitis due to plants, except food: Secondary | ICD-10-CM | POA: Diagnosis not present

## 2017-08-15 DIAGNOSIS — Z6828 Body mass index (BMI) 28.0-28.9, adult: Secondary | ICD-10-CM | POA: Diagnosis not present

## 2017-10-22 DIAGNOSIS — D2261 Melanocytic nevi of right upper limb, including shoulder: Secondary | ICD-10-CM | POA: Diagnosis not present

## 2017-10-22 DIAGNOSIS — Z85828 Personal history of other malignant neoplasm of skin: Secondary | ICD-10-CM | POA: Diagnosis not present

## 2017-10-22 DIAGNOSIS — L812 Freckles: Secondary | ICD-10-CM | POA: Diagnosis not present

## 2017-10-22 DIAGNOSIS — L820 Inflamed seborrheic keratosis: Secondary | ICD-10-CM | POA: Diagnosis not present

## 2017-10-22 DIAGNOSIS — L821 Other seborrheic keratosis: Secondary | ICD-10-CM | POA: Diagnosis not present

## 2017-10-22 DIAGNOSIS — D1801 Hemangioma of skin and subcutaneous tissue: Secondary | ICD-10-CM | POA: Diagnosis not present

## 2017-11-28 DIAGNOSIS — R69 Illness, unspecified: Secondary | ICD-10-CM | POA: Diagnosis not present

## 2017-12-04 DIAGNOSIS — Z961 Presence of intraocular lens: Secondary | ICD-10-CM | POA: Diagnosis not present

## 2017-12-04 DIAGNOSIS — H35372 Puckering of macula, left eye: Secondary | ICD-10-CM | POA: Diagnosis not present

## 2018-01-28 DIAGNOSIS — R82998 Other abnormal findings in urine: Secondary | ICD-10-CM | POA: Diagnosis not present

## 2018-01-28 DIAGNOSIS — E7849 Other hyperlipidemia: Secondary | ICD-10-CM | POA: Diagnosis not present

## 2018-01-28 DIAGNOSIS — Z125 Encounter for screening for malignant neoplasm of prostate: Secondary | ICD-10-CM | POA: Diagnosis not present

## 2018-01-28 DIAGNOSIS — I10 Essential (primary) hypertension: Secondary | ICD-10-CM | POA: Diagnosis not present

## 2018-02-04 DIAGNOSIS — N528 Other male erectile dysfunction: Secondary | ICD-10-CM | POA: Diagnosis not present

## 2018-02-04 DIAGNOSIS — M79643 Pain in unspecified hand: Secondary | ICD-10-CM | POA: Diagnosis not present

## 2018-02-04 DIAGNOSIS — M1990 Unspecified osteoarthritis, unspecified site: Secondary | ICD-10-CM | POA: Diagnosis not present

## 2018-02-04 DIAGNOSIS — Z1389 Encounter for screening for other disorder: Secondary | ICD-10-CM | POA: Diagnosis not present

## 2018-02-04 DIAGNOSIS — I10 Essential (primary) hypertension: Secondary | ICD-10-CM | POA: Diagnosis not present

## 2018-02-04 DIAGNOSIS — Z23 Encounter for immunization: Secondary | ICD-10-CM | POA: Diagnosis not present

## 2018-02-04 DIAGNOSIS — R011 Cardiac murmur, unspecified: Secondary | ICD-10-CM | POA: Diagnosis not present

## 2018-02-04 DIAGNOSIS — Z0000 Encounter for general adult medical examination without abnormal findings: Secondary | ICD-10-CM | POA: Diagnosis not present

## 2018-02-04 DIAGNOSIS — E559 Vitamin D deficiency, unspecified: Secondary | ICD-10-CM | POA: Diagnosis not present

## 2018-02-04 DIAGNOSIS — Z1212 Encounter for screening for malignant neoplasm of rectum: Secondary | ICD-10-CM | POA: Diagnosis not present

## 2018-02-04 DIAGNOSIS — E7849 Other hyperlipidemia: Secondary | ICD-10-CM | POA: Diagnosis not present

## 2018-03-10 DIAGNOSIS — G4733 Obstructive sleep apnea (adult) (pediatric): Secondary | ICD-10-CM | POA: Diagnosis not present

## 2018-03-10 DIAGNOSIS — R400 Somnolence: Secondary | ICD-10-CM | POA: Diagnosis not present

## 2018-04-23 DIAGNOSIS — G4733 Obstructive sleep apnea (adult) (pediatric): Secondary | ICD-10-CM | POA: Diagnosis not present

## 2018-08-11 DIAGNOSIS — M25552 Pain in left hip: Secondary | ICD-10-CM | POA: Diagnosis not present

## 2018-08-18 DIAGNOSIS — M25552 Pain in left hip: Secondary | ICD-10-CM | POA: Diagnosis not present

## 2018-09-11 DIAGNOSIS — E7849 Other hyperlipidemia: Secondary | ICD-10-CM | POA: Diagnosis not present

## 2018-10-02 DIAGNOSIS — G4733 Obstructive sleep apnea (adult) (pediatric): Secondary | ICD-10-CM | POA: Diagnosis not present

## 2018-10-23 DIAGNOSIS — L820 Inflamed seborrheic keratosis: Secondary | ICD-10-CM | POA: Diagnosis not present

## 2018-10-23 DIAGNOSIS — Z85828 Personal history of other malignant neoplasm of skin: Secondary | ICD-10-CM | POA: Diagnosis not present

## 2018-10-23 DIAGNOSIS — D1801 Hemangioma of skin and subcutaneous tissue: Secondary | ICD-10-CM | POA: Diagnosis not present

## 2018-10-23 DIAGNOSIS — L84 Corns and callosities: Secondary | ICD-10-CM | POA: Diagnosis not present

## 2018-10-23 DIAGNOSIS — L821 Other seborrheic keratosis: Secondary | ICD-10-CM | POA: Diagnosis not present

## 2018-11-02 DIAGNOSIS — G4733 Obstructive sleep apnea (adult) (pediatric): Secondary | ICD-10-CM | POA: Diagnosis not present

## 2018-11-17 DIAGNOSIS — H43812 Vitreous degeneration, left eye: Secondary | ICD-10-CM | POA: Diagnosis not present

## 2018-11-17 DIAGNOSIS — H35372 Puckering of macula, left eye: Secondary | ICD-10-CM | POA: Diagnosis not present

## 2018-11-17 DIAGNOSIS — Z961 Presence of intraocular lens: Secondary | ICD-10-CM | POA: Diagnosis not present

## 2018-12-02 DIAGNOSIS — G4733 Obstructive sleep apnea (adult) (pediatric): Secondary | ICD-10-CM | POA: Diagnosis not present

## 2018-12-08 ENCOUNTER — Other Ambulatory Visit: Payer: Self-pay

## 2018-12-08 ENCOUNTER — Ambulatory Visit (INDEPENDENT_AMBULATORY_CARE_PROVIDER_SITE_OTHER): Payer: Medicare HMO | Admitting: Orthopaedic Surgery

## 2018-12-08 DIAGNOSIS — M25552 Pain in left hip: Secondary | ICD-10-CM

## 2018-12-08 DIAGNOSIS — M7062 Trochanteric bursitis, left hip: Secondary | ICD-10-CM

## 2018-12-08 MED ORDER — LIDOCAINE HCL 1 % IJ SOLN
3.0000 mL | INTRAMUSCULAR | Status: AC | PRN
  Administered 2018-12-08: 14:00:00 3 mL

## 2018-12-08 MED ORDER — METHYLPREDNISOLONE ACETATE 40 MG/ML IJ SUSP
40.0000 mg | INTRAMUSCULAR | Status: AC | PRN
  Administered 2018-12-08: 40 mg via INTRA_ARTICULAR

## 2018-12-08 NOTE — Progress Notes (Signed)
Office Visit Note   Patient: Kevin Ramirez           Date of Birth: 1945/08/20           MRN: [id number] Visit Date: 12/08/2018              Requested by: Elias Else, MD [phone] [address] [place],  [place] [address] PCP: Elias Else, MD   Assessment & Plan: Visit Diagnoses:  1. Pain in left hip   2. Trochanteric bursitis, left hip     Plan: His clinical exam and signs and symptoms are consistent with trochanteric bursitis and IT band syndrome.  I explained this to him in detail.  His hip proper in terms of left hip is normal.  This is not arthritic hip and I explained that to him as well.  I did recommend a steroid injection of the trochanteric area and he tolerated this well.  We will see him back in 6 weeks to consider repeat injection which I do not mind performing then given the fact that he is not a diabetic and this is not a joint.  He will also try Voltaren gel and he knows not to lay on that side.  I did show him some stretching exercises that I want him to try daily as well.  All question concerns were answered and addressed.  Follow-Up Instructions: Return in about 6 weeks (around 01/19/2019).   Orders:  Orders Placed This Encounter  Procedures  . Large Joint Inj   No orders of the defined types were placed in this encounter.     Procedures: Large Joint Inj: L greater trochanter on 12/08/2018 2:10 PM Indications: pain and diagnostic evaluation Details: 22 G 1.5 in needle, lateral approach  Arthrogram: No  Medications: 3 mL lidocaine 1 %; 40 mg methylPREDNISolone acetate 40 MG/ML Outcome: tolerated well, no immediate complications Procedure, treatment alternatives, risks and benefits explained, specific risks discussed. Consent was given by the patient. Immediately prior to procedure a time out was called to verify the correct patient, procedure, equipment, support staff and site/side marked as required. Patient was prepped and draped in the usual  sterile fashion.       Clinical Data: No additional findings.   Subjective: Chief Complaint  Patient presents with  . Left Hip - Pain  Patient is a very pleasant 73 year old gentleman I am seeing for the first time.  Comes in for evaluation treatment of left hip pain.  He actually brings a paper copy of an AP x-ray of his pelvis that was taken today with a sports medicine physician.  He has never had a injection in his left hip.  He points to the lateral aspect of it as the source of his pain.  Is been going on for 6 normal months now.  He denies any injury.  He denies any groin pain.  He did go to physical therapy which got him through the summer.  He does walk a lot and has been painful walking.  He denies any other injuries.  Denies any knee pain.  HPI  Review of Systems Currently denies any headache, chest pain, shortness of breath, fever, chills, nausea, vomiting  Objective: Vital Signs: There were no vitals taken for this visit.  Physical Exam He is alert and orient x3 and in no acute distress Ortho Exam Examination of his left and right hips were normal in terms of full range of motion with no pain in the groin  at all no blocks to motion or rotation.  When I had him lay on his right side with his left side up he is tender to palpation over the trochanteric area and the IT band. Specialty Comments:  No specialty comments available.  Imaging: No results found. I did look at the paper copy of an AP pelvis that he brought with him and he has excellent hip joint space on both sides.  There is just a very small periarticular osteophyte off the lateral edge of the acetabulum left side but it is very small in the joint space is very well-maintained.  This is not significant osteoarthritis at all with his hips.  PMFS History: Patient Active Problem List   Diagnosis Date Noted  . 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 Ganglion cyst 05/30/2015   Past Medical History:   Diagnosis Date  . Arthritis   . Hypertension   . Sleep apnea    cpap nightly    Family History  Problem Relation Age of Onset  . Heart block Father   . Cancer Sister     Past Surgical History:  Procedure Laterality Date  . arthroscopic surgery Left knee yrs ago   x 2  . CARPAL TUNNEL RELEASE Right 01/15/2017   Procedure: RIGHT CARPAL TUNNEL RELEASE;  Surgeon: Betha Loa, MD;  Location: [facility];  Service: Orthopedics;  Laterality: Right;  . CARPAL TUNNEL RELEASE Left 03/21/2017   Procedure: LEFT CARPAL TUNNEL RELEASE;  Surgeon: Betha Loa, MD;  Location: [facility];  Service: Orthopedics;  Laterality: Left;  . COLONOSCOPY WITH PROPOFOL N/A 07/28/2013   Procedure: COLONOSCOPY WITH PROPOFOL;  Surgeon: Charolett Bumpers, MD;  Location: [facility];  Service: Endoscopy;  Laterality: N/A;  . left knee     arothroscopy  . surgery to chest  age 66   to bring chest wall in -"pigeon breasted"  . TOTAL KNEE ARTHROPLASTY Left 02/27/2016   Procedure: TOTAL KNEE ARTHROPLASTY;  Surgeon: Dannielle Huh, MD;  Location: [facility];  Service: Orthopedics;  Laterality: Left;   Social History   Occupational History  . Not on file  Tobacco Use  . Smoking status: Never Smoker  . Smokeless tobacco: Never Used  Substance and Sexual Activity  . Alcohol use: Yes    Comment: occasional  . Drug use: No  . Sexual activity: Not on file

## 2018-12-10 DIAGNOSIS — Z961 Presence of intraocular lens: Secondary | ICD-10-CM | POA: Diagnosis not present

## 2018-12-10 DIAGNOSIS — H5202 Hypermetropia, left eye: Secondary | ICD-10-CM | POA: Diagnosis not present

## 2018-12-10 DIAGNOSIS — H35372 Puckering of macula, left eye: Secondary | ICD-10-CM | POA: Diagnosis not present

## 2018-12-10 DIAGNOSIS — H43812 Vitreous degeneration, left eye: Secondary | ICD-10-CM | POA: Diagnosis not present

## 2019-01-19 ENCOUNTER — Ambulatory Visit: Payer: Medicare HMO | Admitting: Orthopaedic Surgery

## 2019-01-26 ENCOUNTER — Encounter: Payer: Self-pay | Admitting: Orthopaedic Surgery

## 2019-01-26 ENCOUNTER — Ambulatory Visit: Payer: Medicare HMO | Admitting: Orthopaedic Surgery

## 2019-01-26 ENCOUNTER — Other Ambulatory Visit: Payer: Self-pay

## 2019-01-26 DIAGNOSIS — M7062 Trochanteric bursitis, left hip: Secondary | ICD-10-CM

## 2019-01-26 MED ORDER — DICLOFENAC SODIUM 75 MG PO TBEC
75.0000 mg | DELAYED_RELEASE_TABLET | Freq: Two times a day (BID) | ORAL | 0 refills | Status: DC
Start: 2019-01-26 — End: 2022-01-22

## 2019-01-26 NOTE — Progress Notes (Signed)
Office Visit Note   Patient: Kevin Ramirez           Date of Birth: [birth year]           MRN: [id number] Visit Date: 01/26/2019              Requested by: Elias Else, MD [phone] [address] [place],  [place] [address] PCP: Elias Else, MD   Assessment & Plan: Visit Diagnoses:  1. Trochanteric bursitis, left hip     Plan: We will have him do IT band stretching as shown by therapy and also by Dr. Magnus Ivan.  Placed him on diclofenac orally 75 mg twice daily.  We will stop taking aspirin.  We will have him follow-up with [place] in 4 weeks to see what type of response he had to the stretching and the oral diclofenac.  Questions encouraged and answered.  May consider repeat injection at that time if necessary.  Follow-Up Instructions: Return in about 4 weeks (around 02/23/2019).   Orders:  No orders of the defined types were placed in this encounter.  No orders of the defined types were placed in this encounter.     Procedures: No procedures performed   Clinical Data: No additional findings.   Subjective: Chief Complaint  Patient presents with  . Left Hip - Follow-up    HPI  Review of Systems   Objective: Vital Signs: There were no vitals taken for this visit.  Physical Exam Constitutional:      Appearance: He is not ill-appearing or diaphoretic.  Pulmonary:     Effort: Pulmonary effort is normal.  Neurological:     Mental Status: He is alert and oriented to person, place, and time.  Psychiatric:        Mood and Affect: Mood normal.     Ortho Exam Left hip good range of motion extreme external rotation causes pain lateral aspect hip.  He is tender in the trochanteric region and down the IT band. Specialty Comments:  No specialty comments available.  Imaging: No results found.   PMFS History: Patient Active Problem List   Diagnosis Date Noted  . 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 Ganglion cyst 05/30/2015   Past Medical History:   Diagnosis Date  . Arthritis   . Hypertension   . Sleep apnea    cpap nightly    Family History  Problem Relation Age of Onset  . Heart block Father   . Cancer Sister     Past Surgical History:  Procedure Laterality Date  . arthroscopic surgery Left knee yrs ago   x 2  . CARPAL TUNNEL RELEASE Right 01/15/2017   Procedure: RIGHT CARPAL TUNNEL RELEASE;  Surgeon: Betha Loa, MD;  Location: [facility];  Service: Orthopedics;  Laterality: Right;  . CARPAL TUNNEL RELEASE Left 03/21/2017   Procedure: LEFT CARPAL TUNNEL RELEASE;  Surgeon: Betha Loa, MD;  Location: [facility];  Service: Orthopedics;  Laterality: Left;  . COLONOSCOPY WITH PROPOFOL N/A 07/28/2013   Procedure: COLONOSCOPY WITH PROPOFOL;  Surgeon: Charolett Bumpers, MD;  Location: [facility];  Service: Endoscopy;  Laterality: N/A;  . left knee     arothroscopy  . surgery to chest  age 9   to bring chest wall in -"pigeon breasted"  . TOTAL KNEE ARTHROPLASTY Left 02/27/2016   Procedure: TOTAL KNEE ARTHROPLASTY;  Surgeon: Dannielle Huh, MD;  Location: [facility];  Service: Orthopedics;  Laterality: Left;   Social History  Occupational History  . Not on file  Tobacco Use  . Smoking status: Never Smoker  . Smokeless tobacco: Never Used  Substance and Sexual Activity  . Alcohol use: Yes    Comment: occasional  . Drug use: No  . Sexual activity: Not on file

## 2019-02-12 DIAGNOSIS — W01198A Fall on same level from slipping, tripping and stumbling with subsequent striking against other object, initial encounter: Secondary | ICD-10-CM | POA: Diagnosis not present

## 2019-02-12 DIAGNOSIS — S20212A Contusion of left front wall of thorax, initial encounter: Secondary | ICD-10-CM | POA: Diagnosis not present

## 2019-02-23 ENCOUNTER — Ambulatory Visit: Payer: Medicare HMO | Admitting: Orthopaedic Surgery

## 2019-03-11 ENCOUNTER — Ambulatory Visit: Payer: Medicare HMO | Admitting: Orthopaedic Surgery

## 2019-03-17 DIAGNOSIS — Z125 Encounter for screening for malignant neoplasm of prostate: Secondary | ICD-10-CM | POA: Diagnosis not present

## 2019-03-17 DIAGNOSIS — E7849 Other hyperlipidemia: Secondary | ICD-10-CM | POA: Diagnosis not present

## 2019-03-17 DIAGNOSIS — E559 Vitamin D deficiency, unspecified: Secondary | ICD-10-CM | POA: Diagnosis not present

## 2019-03-17 DIAGNOSIS — Z0000 Encounter for general adult medical examination without abnormal findings: Secondary | ICD-10-CM | POA: Diagnosis not present

## 2019-03-19 DIAGNOSIS — E7849 Other hyperlipidemia: Secondary | ICD-10-CM | POA: Diagnosis not present

## 2019-03-19 DIAGNOSIS — R82998 Other abnormal findings in urine: Secondary | ICD-10-CM | POA: Diagnosis not present

## 2019-03-24 DIAGNOSIS — E559 Vitamin D deficiency, unspecified: Secondary | ICD-10-CM | POA: Diagnosis not present

## 2019-03-24 DIAGNOSIS — N529 Male erectile dysfunction, unspecified: Secondary | ICD-10-CM | POA: Diagnosis not present

## 2019-03-24 DIAGNOSIS — S20212D Contusion of left front wall of thorax, subsequent encounter: Secondary | ICD-10-CM | POA: Diagnosis not present

## 2019-03-24 DIAGNOSIS — L237 Allergic contact dermatitis due to plants, except food: Secondary | ICD-10-CM | POA: Diagnosis not present

## 2019-03-24 DIAGNOSIS — E785 Hyperlipidemia, unspecified: Secondary | ICD-10-CM | POA: Diagnosis not present

## 2019-03-24 DIAGNOSIS — I10 Essential (primary) hypertension: Secondary | ICD-10-CM | POA: Diagnosis not present

## 2019-03-24 DIAGNOSIS — M79643 Pain in unspecified hand: Secondary | ICD-10-CM | POA: Diagnosis not present

## 2019-03-24 DIAGNOSIS — Z0000 Encounter for general adult medical examination without abnormal findings: Secondary | ICD-10-CM | POA: Diagnosis not present

## 2019-03-24 DIAGNOSIS — M1990 Unspecified osteoarthritis, unspecified site: Secondary | ICD-10-CM | POA: Diagnosis not present

## 2019-03-24 DIAGNOSIS — R011 Cardiac murmur, unspecified: Secondary | ICD-10-CM | POA: Diagnosis not present

## 2019-03-26 DIAGNOSIS — Z23 Encounter for immunization: Secondary | ICD-10-CM | POA: Diagnosis not present

## 2019-03-30 DIAGNOSIS — Z1212 Encounter for screening for malignant neoplasm of rectum: Secondary | ICD-10-CM | POA: Diagnosis not present

## 2019-05-07 DIAGNOSIS — G4733 Obstructive sleep apnea (adult) (pediatric): Secondary | ICD-10-CM | POA: Diagnosis not present

## 2019-05-12 DIAGNOSIS — M19042 Primary osteoarthritis, left hand: Secondary | ICD-10-CM | POA: Diagnosis not present

## 2019-05-12 DIAGNOSIS — M79642 Pain in left hand: Secondary | ICD-10-CM | POA: Diagnosis not present

## 2019-05-26 DIAGNOSIS — S20212A Contusion of left front wall of thorax, initial encounter: Secondary | ICD-10-CM | POA: Diagnosis not present

## 2019-06-19 DIAGNOSIS — G4733 Obstructive sleep apnea (adult) (pediatric): Secondary | ICD-10-CM | POA: Diagnosis not present

## 2019-08-13 DIAGNOSIS — R69 Illness, unspecified: Secondary | ICD-10-CM | POA: Diagnosis not present

## 2019-10-27 DIAGNOSIS — L723 Sebaceous cyst: Secondary | ICD-10-CM | POA: Diagnosis not present

## 2019-10-27 DIAGNOSIS — L812 Freckles: Secondary | ICD-10-CM | POA: Diagnosis not present

## 2019-10-27 DIAGNOSIS — Z85828 Personal history of other malignant neoplasm of skin: Secondary | ICD-10-CM | POA: Diagnosis not present

## 2019-10-27 DIAGNOSIS — L821 Other seborrheic keratosis: Secondary | ICD-10-CM | POA: Diagnosis not present

## 2019-11-10 DIAGNOSIS — Z20828 Contact with and (suspected) exposure to other viral communicable diseases: Secondary | ICD-10-CM | POA: Diagnosis not present

## 2019-12-25 DIAGNOSIS — G4733 Obstructive sleep apnea (adult) (pediatric): Secondary | ICD-10-CM | POA: Diagnosis not present

## 2020-01-06 DIAGNOSIS — H524 Presbyopia: Secondary | ICD-10-CM | POA: Diagnosis not present

## 2020-01-06 DIAGNOSIS — Z961 Presence of intraocular lens: Secondary | ICD-10-CM | POA: Diagnosis not present

## 2020-01-06 DIAGNOSIS — H35372 Puckering of macula, left eye: Secondary | ICD-10-CM | POA: Diagnosis not present

## 2020-01-24 DIAGNOSIS — G4733 Obstructive sleep apnea (adult) (pediatric): Secondary | ICD-10-CM | POA: Diagnosis not present

## 2020-01-29 DIAGNOSIS — H3581 Retinal edema: Secondary | ICD-10-CM | POA: Diagnosis not present

## 2020-01-29 DIAGNOSIS — H35372 Puckering of macula, left eye: Secondary | ICD-10-CM | POA: Diagnosis not present

## 2020-01-29 DIAGNOSIS — H43812 Vitreous degeneration, left eye: Secondary | ICD-10-CM | POA: Diagnosis not present

## 2020-02-09 DIAGNOSIS — R69 Illness, unspecified: Secondary | ICD-10-CM | POA: Diagnosis not present

## 2020-02-15 DIAGNOSIS — H35372 Puckering of macula, left eye: Secondary | ICD-10-CM | POA: Diagnosis not present

## 2020-02-15 DIAGNOSIS — H26492 Other secondary cataract, left eye: Secondary | ICD-10-CM | POA: Diagnosis not present

## 2020-02-15 DIAGNOSIS — H3581 Retinal edema: Secondary | ICD-10-CM | POA: Diagnosis not present

## 2020-02-16 DIAGNOSIS — H3581 Retinal edema: Secondary | ICD-10-CM | POA: Diagnosis not present

## 2020-02-16 DIAGNOSIS — H35372 Puckering of macula, left eye: Secondary | ICD-10-CM | POA: Diagnosis not present

## 2020-02-23 DIAGNOSIS — H3581 Retinal edema: Secondary | ICD-10-CM | POA: Diagnosis not present

## 2020-02-23 DIAGNOSIS — H35372 Puckering of macula, left eye: Secondary | ICD-10-CM | POA: Diagnosis not present

## 2020-02-24 DIAGNOSIS — G4733 Obstructive sleep apnea (adult) (pediatric): Secondary | ICD-10-CM | POA: Diagnosis not present

## 2020-03-16 DIAGNOSIS — H3581 Retinal edema: Secondary | ICD-10-CM | POA: Diagnosis not present

## 2020-04-05 DIAGNOSIS — E785 Hyperlipidemia, unspecified: Secondary | ICD-10-CM | POA: Diagnosis not present

## 2020-04-05 DIAGNOSIS — M1990 Unspecified osteoarthritis, unspecified site: Secondary | ICD-10-CM | POA: Diagnosis not present

## 2020-04-05 DIAGNOSIS — E559 Vitamin D deficiency, unspecified: Secondary | ICD-10-CM | POA: Diagnosis not present

## 2020-04-05 DIAGNOSIS — Z125 Encounter for screening for malignant neoplasm of prostate: Secondary | ICD-10-CM | POA: Diagnosis not present

## 2020-04-08 DIAGNOSIS — I10 Essential (primary) hypertension: Secondary | ICD-10-CM | POA: Diagnosis not present

## 2020-04-08 DIAGNOSIS — R82998 Other abnormal findings in urine: Secondary | ICD-10-CM | POA: Diagnosis not present

## 2020-04-08 DIAGNOSIS — Z1212 Encounter for screening for malignant neoplasm of rectum: Secondary | ICD-10-CM | POA: Diagnosis not present

## 2020-04-11 DIAGNOSIS — Z1389 Encounter for screening for other disorder: Secondary | ICD-10-CM | POA: Diagnosis not present

## 2020-04-11 DIAGNOSIS — R011 Cardiac murmur, unspecified: Secondary | ICD-10-CM | POA: Diagnosis not present

## 2020-04-11 DIAGNOSIS — I10 Essential (primary) hypertension: Secondary | ICD-10-CM | POA: Diagnosis not present

## 2020-04-11 DIAGNOSIS — Z0000 Encounter for general adult medical examination without abnormal findings: Secondary | ICD-10-CM | POA: Diagnosis not present

## 2020-04-11 DIAGNOSIS — M1990 Unspecified osteoarthritis, unspecified site: Secondary | ICD-10-CM | POA: Diagnosis not present

## 2020-04-11 DIAGNOSIS — E559 Vitamin D deficiency, unspecified: Secondary | ICD-10-CM | POA: Diagnosis not present

## 2020-04-11 DIAGNOSIS — E785 Hyperlipidemia, unspecified: Secondary | ICD-10-CM | POA: Diagnosis not present

## 2020-06-13 DIAGNOSIS — G4733 Obstructive sleep apnea (adult) (pediatric): Secondary | ICD-10-CM | POA: Diagnosis not present

## 2020-08-31 DIAGNOSIS — Z85828 Personal history of other malignant neoplasm of skin: Secondary | ICD-10-CM | POA: Diagnosis not present

## 2020-08-31 DIAGNOSIS — L821 Other seborrheic keratosis: Secondary | ICD-10-CM | POA: Diagnosis not present

## 2020-08-31 DIAGNOSIS — I8392 Asymptomatic varicose veins of left lower extremity: Secondary | ICD-10-CM | POA: Diagnosis not present

## 2020-08-31 DIAGNOSIS — D225 Melanocytic nevi of trunk: Secondary | ICD-10-CM | POA: Diagnosis not present

## 2020-08-31 DIAGNOSIS — B351 Tinea unguium: Secondary | ICD-10-CM | POA: Diagnosis not present

## 2020-08-31 DIAGNOSIS — L820 Inflamed seborrheic keratosis: Secondary | ICD-10-CM | POA: Diagnosis not present

## 2020-08-31 DIAGNOSIS — D1801 Hemangioma of skin and subcutaneous tissue: Secondary | ICD-10-CM | POA: Diagnosis not present

## 2020-08-31 DIAGNOSIS — L812 Freckles: Secondary | ICD-10-CM | POA: Diagnosis not present

## 2020-10-25 DIAGNOSIS — C44311 Basal cell carcinoma of skin of nose: Secondary | ICD-10-CM | POA: Diagnosis not present

## 2020-10-25 DIAGNOSIS — D1801 Hemangioma of skin and subcutaneous tissue: Secondary | ICD-10-CM | POA: Diagnosis not present

## 2020-10-25 DIAGNOSIS — D485 Neoplasm of uncertain behavior of skin: Secondary | ICD-10-CM | POA: Diagnosis not present

## 2020-10-25 DIAGNOSIS — C44319 Basal cell carcinoma of skin of other parts of face: Secondary | ICD-10-CM | POA: Diagnosis not present

## 2020-11-21 DIAGNOSIS — C44311 Basal cell carcinoma of skin of nose: Secondary | ICD-10-CM | POA: Diagnosis not present

## 2021-01-05 DIAGNOSIS — G4733 Obstructive sleep apnea (adult) (pediatric): Secondary | ICD-10-CM | POA: Diagnosis not present

## 2021-01-06 DIAGNOSIS — H26491 Other secondary cataract, right eye: Secondary | ICD-10-CM | POA: Diagnosis not present

## 2021-01-06 DIAGNOSIS — H524 Presbyopia: Secondary | ICD-10-CM | POA: Diagnosis not present

## 2021-01-06 DIAGNOSIS — Z961 Presence of intraocular lens: Secondary | ICD-10-CM | POA: Diagnosis not present

## 2021-02-04 DIAGNOSIS — G4733 Obstructive sleep apnea (adult) (pediatric): Secondary | ICD-10-CM | POA: Diagnosis not present

## 2021-02-21 DIAGNOSIS — M419 Scoliosis, unspecified: Secondary | ICD-10-CM | POA: Diagnosis not present

## 2021-02-21 DIAGNOSIS — M5136 Other intervertebral disc degeneration, lumbar region: Secondary | ICD-10-CM | POA: Diagnosis not present

## 2021-02-23 ENCOUNTER — Other Ambulatory Visit: Payer: Self-pay | Admitting: Neurological Surgery

## 2021-02-23 DIAGNOSIS — M419 Scoliosis, unspecified: Secondary | ICD-10-CM

## 2021-02-23 DIAGNOSIS — G4733 Obstructive sleep apnea (adult) (pediatric): Secondary | ICD-10-CM | POA: Diagnosis not present

## 2021-03-07 DIAGNOSIS — G4733 Obstructive sleep apnea (adult) (pediatric): Secondary | ICD-10-CM | POA: Diagnosis not present

## 2021-03-09 ENCOUNTER — Ambulatory Visit
Admission: RE | Admit: 2021-03-09 | Discharge: 2021-03-09 | Disposition: A | Discharge status: Home / Self Care | Payer: Medicare HMO | Source: Ambulatory Visit | Attending: Neurological Surgery | Admitting: Neurological Surgery

## 2021-03-09 ENCOUNTER — Other Ambulatory Visit: Payer: Self-pay

## 2021-03-09 DIAGNOSIS — M48061 Spinal stenosis, lumbar region without neurogenic claudication: Secondary | ICD-10-CM | POA: Diagnosis not present

## 2021-03-09 DIAGNOSIS — M4126 Other idiopathic scoliosis, lumbar region: Secondary | ICD-10-CM | POA: Diagnosis not present

## 2021-03-09 DIAGNOSIS — M545 Low back pain, unspecified: Secondary | ICD-10-CM | POA: Diagnosis not present

## 2021-03-09 DIAGNOSIS — M419 Scoliosis, unspecified: Secondary | ICD-10-CM

## 2021-03-16 DIAGNOSIS — M5136 Other intervertebral disc degeneration, lumbar region: Secondary | ICD-10-CM | POA: Diagnosis not present

## 2021-04-07 DIAGNOSIS — G4733 Obstructive sleep apnea (adult) (pediatric): Secondary | ICD-10-CM | POA: Diagnosis not present

## 2021-05-05 DIAGNOSIS — G4733 Obstructive sleep apnea (adult) (pediatric): Secondary | ICD-10-CM | POA: Diagnosis not present

## 2021-05-05 DIAGNOSIS — M545 Low back pain, unspecified: Secondary | ICD-10-CM | POA: Diagnosis not present

## 2021-05-18 DIAGNOSIS — M545 Low back pain, unspecified: Secondary | ICD-10-CM | POA: Diagnosis not present

## 2021-05-25 DIAGNOSIS — I10 Essential (primary) hypertension: Secondary | ICD-10-CM | POA: Diagnosis not present

## 2021-05-25 DIAGNOSIS — Z125 Encounter for screening for malignant neoplasm of prostate: Secondary | ICD-10-CM | POA: Diagnosis not present

## 2021-05-25 DIAGNOSIS — E559 Vitamin D deficiency, unspecified: Secondary | ICD-10-CM | POA: Diagnosis not present

## 2021-05-25 DIAGNOSIS — E785 Hyperlipidemia, unspecified: Secondary | ICD-10-CM | POA: Diagnosis not present

## 2021-06-02 DIAGNOSIS — I499 Cardiac arrhythmia, unspecified: Secondary | ICD-10-CM | POA: Diagnosis not present

## 2021-06-02 DIAGNOSIS — M5136 Other intervertebral disc degeneration, lumbar region: Secondary | ICD-10-CM | POA: Diagnosis not present

## 2021-06-02 DIAGNOSIS — R82998 Other abnormal findings in urine: Secondary | ICD-10-CM | POA: Diagnosis not present

## 2021-06-02 DIAGNOSIS — M1990 Unspecified osteoarthritis, unspecified site: Secondary | ICD-10-CM | POA: Diagnosis not present

## 2021-06-02 DIAGNOSIS — R011 Cardiac murmur, unspecified: Secondary | ICD-10-CM | POA: Diagnosis not present

## 2021-06-02 DIAGNOSIS — Z23 Encounter for immunization: Secondary | ICD-10-CM | POA: Diagnosis not present

## 2021-06-02 DIAGNOSIS — E785 Hyperlipidemia, unspecified: Secondary | ICD-10-CM | POA: Diagnosis not present

## 2021-06-02 DIAGNOSIS — Z0000 Encounter for general adult medical examination without abnormal findings: Secondary | ICD-10-CM | POA: Diagnosis not present

## 2021-06-02 DIAGNOSIS — Z1212 Encounter for screening for malignant neoplasm of rectum: Secondary | ICD-10-CM | POA: Diagnosis not present

## 2021-06-02 DIAGNOSIS — I10 Essential (primary) hypertension: Secondary | ICD-10-CM | POA: Diagnosis not present

## 2021-06-05 ENCOUNTER — Other Ambulatory Visit: Payer: Self-pay | Admitting: Internal Medicine

## 2021-06-05 DIAGNOSIS — G4733 Obstructive sleep apnea (adult) (pediatric): Secondary | ICD-10-CM | POA: Diagnosis not present

## 2021-06-05 DIAGNOSIS — E785 Hyperlipidemia, unspecified: Secondary | ICD-10-CM

## 2021-06-21 DIAGNOSIS — G4733 Obstructive sleep apnea (adult) (pediatric): Secondary | ICD-10-CM | POA: Diagnosis not present

## 2021-06-23 ENCOUNTER — Ambulatory Visit
Admission: RE | Admit: 2021-06-23 | Discharge: 2021-06-23 | Disposition: A | Discharge status: Home / Self Care | Payer: No Typology Code available for payment source | Source: Ambulatory Visit | Attending: Internal Medicine | Admitting: Internal Medicine

## 2021-06-23 DIAGNOSIS — E785 Hyperlipidemia, unspecified: Secondary | ICD-10-CM

## 2021-07-05 DIAGNOSIS — G4733 Obstructive sleep apnea (adult) (pediatric): Secondary | ICD-10-CM | POA: Diagnosis not present

## 2021-07-22 DIAGNOSIS — G4733 Obstructive sleep apnea (adult) (pediatric): Secondary | ICD-10-CM | POA: Diagnosis not present

## 2021-07-26 ENCOUNTER — Institutional Professional Consult (permissible substitution): Payer: Medicare HMO | Admitting: Cardiology

## 2021-08-05 DIAGNOSIS — G4733 Obstructive sleep apnea (adult) (pediatric): Secondary | ICD-10-CM | POA: Diagnosis not present

## 2021-08-21 DIAGNOSIS — G4733 Obstructive sleep apnea (adult) (pediatric): Secondary | ICD-10-CM | POA: Diagnosis not present

## 2021-09-04 DIAGNOSIS — G4733 Obstructive sleep apnea (adult) (pediatric): Secondary | ICD-10-CM | POA: Diagnosis not present

## 2021-09-05 DIAGNOSIS — Z85828 Personal history of other malignant neoplasm of skin: Secondary | ICD-10-CM | POA: Diagnosis not present

## 2021-09-05 DIAGNOSIS — L820 Inflamed seborrheic keratosis: Secondary | ICD-10-CM | POA: Diagnosis not present

## 2021-09-05 DIAGNOSIS — L821 Other seborrheic keratosis: Secondary | ICD-10-CM | POA: Diagnosis not present

## 2021-09-05 DIAGNOSIS — L812 Freckles: Secondary | ICD-10-CM | POA: Diagnosis not present

## 2021-09-05 DIAGNOSIS — C44319 Basal cell carcinoma of skin of other parts of face: Secondary | ICD-10-CM | POA: Diagnosis not present

## 2021-09-05 DIAGNOSIS — L245 Irritant contact dermatitis due to other chemical products: Secondary | ICD-10-CM | POA: Diagnosis not present

## 2021-09-05 DIAGNOSIS — L578 Other skin changes due to chronic exposure to nonionizing radiation: Secondary | ICD-10-CM | POA: Diagnosis not present

## 2021-09-05 DIAGNOSIS — L720 Epidermal cyst: Secondary | ICD-10-CM | POA: Diagnosis not present

## 2021-10-05 DIAGNOSIS — G4733 Obstructive sleep apnea (adult) (pediatric): Secondary | ICD-10-CM | POA: Diagnosis not present

## 2021-10-17 DIAGNOSIS — C44319 Basal cell carcinoma of skin of other parts of face: Secondary | ICD-10-CM | POA: Diagnosis not present

## 2021-12-05 DIAGNOSIS — G4733 Obstructive sleep apnea (adult) (pediatric): Secondary | ICD-10-CM | POA: Diagnosis not present

## 2021-12-06 ENCOUNTER — Encounter: Payer: Self-pay | Admitting: Orthopaedic Surgery

## 2021-12-06 ENCOUNTER — Ambulatory Visit: Payer: Medicare HMO | Admitting: Orthopaedic Surgery

## 2021-12-06 ENCOUNTER — Ambulatory Visit (INDEPENDENT_AMBULATORY_CARE_PROVIDER_SITE_OTHER): Payer: Medicare HMO

## 2021-12-06 DIAGNOSIS — M25552 Pain in left hip: Secondary | ICD-10-CM | POA: Diagnosis not present

## 2021-12-06 DIAGNOSIS — M7062 Trochanteric bursitis, left hip: Secondary | ICD-10-CM | POA: Diagnosis not present

## 2021-12-06 MED ORDER — METHYLPREDNISOLONE ACETATE 40 MG/ML IJ SUSP
40.0000 mg | INTRAMUSCULAR | Status: AC | PRN
  Administered 2021-12-06: 40 mg via INTRA_ARTICULAR

## 2021-12-06 MED ORDER — LIDOCAINE HCL 1 % IJ SOLN
3.0000 mL | INTRAMUSCULAR | Status: AC | PRN
  Administered 2021-12-06: 3 mL

## 2021-12-06 NOTE — Progress Notes (Signed)
The patient is a 76 year old gentleman was seen in the past.  It has been over 3 years now.  At the time it was for left hip trochanteric bursitis.  We provided a steroid injection of the trochanteric area and this helped greatly.  He has been experiencing left hip pain over the lateral aspect of his hip for a few months now.  He said has been hurting more with golf and tennis as well as going up and down stairs.  He denies any groin pain and denies any specific injuries.  He is not diabetic.  He has had no acute changes in his medical status.  On exam he has normal range of motion of his left hip and no pain in the groin with internal and external rotation.  There is only pain to palpation over the proximal IT band and trochanteric area.  An AP pelvis lateral left hip shows normal-appearing hip on the left and right sides.  The joint spaces well-maintained.  There are no cortical irregularities around the trochanteric area.  His signs and symptoms are consistent with trochanteric bursitis.  I recommended steroid injection over this area since this is helped him in the past and he agreed to this and tolerated it well.  I did show him some stretching exercises to try as well.  Follow-up can be as needed.     Procedure Note  Patient: Kevin Ramirez             Date of Birth: [birth year]           MRN: [id number]             Visit Date: 12/06/2021  Procedures: Visit Diagnoses:  1. Pain of left hip   2. Trochanteric bursitis, left hip     Large Joint Inj: L greater trochanter on 12/06/2021 2:02 PM Indications: pain and diagnostic evaluation Details: 22 G 1.5 in needle, lateral approach  Arthrogram: No  Medications: 3 mL lidocaine 1 %; 40 mg methylPREDNISolone acetate 40 MG/ML Outcome: tolerated well, no immediate complications Procedure, treatment alternatives, risks and benefits explained, specific risks discussed. Consent was given by the patient. Immediately prior to procedure a time  out was called to verify the correct patient, procedure, equipment, support staff and site/side marked as required. Patient was prepped and draped in the usual sterile fashion.

## 2022-01-05 DIAGNOSIS — G4733 Obstructive sleep apnea (adult) (pediatric): Secondary | ICD-10-CM | POA: Diagnosis not present

## 2022-01-18 DIAGNOSIS — H524 Presbyopia: Secondary | ICD-10-CM | POA: Diagnosis not present

## 2022-01-18 DIAGNOSIS — Z961 Presence of intraocular lens: Secondary | ICD-10-CM | POA: Diagnosis not present

## 2022-01-19 NOTE — Progress Notes (Unsigned)
Kevin Pinks MD Reason for referral-CAD and murmur  HPI: 76 yo male for evaluation of CAD and murmur at request of Maury Dus MD. Ca score 5/23 306 (50 percentile); ascending aorta 4.4 cm.   Current Outpatient Medications  Medication Sig Dispense Refill   cholecalciferol (VITAMIN D) 1000 units tablet Take 1,000 Units by mouth daily.     diclofenac (VOLTAREN) 75 MG EC tablet Take 1 tablet (75 mg total) by mouth 2 (two) times daily. 30 tablet 0   hydrochlorothiazide (HYDRODIURIL) 25 MG tablet Take 25 mg by mouth every morning.     HYDROcodone-acetaminophen (NORCO) 5-325 MG tablet 1-2 tabs po q6 hours prn pain 20 tablet 0   HYDROcodone-acetaminophen (NORCO) 5-325 MG tablet 1-2 tabs po q6 hours prn pain 20 tablet 0   Multiple Vitamin (MULTIVITAMIN WITH MINERALS) TABS tablet Take 1 tablet by mouth daily.     simvastatin (ZOCOR) 20 MG tablet Take 20 mg by mouth every morning.     zolpidem (AMBIEN) 10 MG tablet Take 1 tablet by mouth at bedtime as needed.  5   No current facility-administered medications for this visit.    Allergies  Allergen Reactions   No Known Allergies      Past Medical History:  Diagnosis Date   Arthritis    Hypertension    Sleep apnea    cpap nightly    Past Surgical History:  Procedure Laterality Date   arthroscopic surgery Left knee yrs ago   x 2   CARPAL TUNNEL RELEASE Right 01/15/2017   Procedure: RIGHT CARPAL TUNNEL RELEASE;  Surgeon: Leanora Cover, MD;  Location: [facility];  Service: Orthopedics;  Laterality: Right;   CARPAL TUNNEL RELEASE Left 03/21/2017   Procedure: LEFT CARPAL TUNNEL RELEASE;  Surgeon: Leanora Cover, MD;  Location: [facility];  Service: Orthopedics;  Laterality: Left;   COLONOSCOPY WITH PROPOFOL N/A 07/28/2013   Procedure: COLONOSCOPY WITH PROPOFOL;  Surgeon: Garlan Fair, MD;  Location: [facility];  Service: Endoscopy;  Laterality: N/A;   left knee     arothroscopy   surgery to  chest  age 59   to bring chest wall in -"pigeon breasted"   TOTAL KNEE ARTHROPLASTY Left 02/27/2016   Procedure: TOTAL KNEE ARTHROPLASTY;  Surgeon: Vickey Huger, MD;  Location: [facility];  Service: Orthopedics;  Laterality: Left;    Social History   Socioeconomic History   Marital status: Married    Spouse name: Not on file   Number of children: Not on file   Years of education: Not on file   Highest education level: Not on file  Occupational History   Not on file  Tobacco Use   Smoking status: Never   Smokeless tobacco: Never  Substance and Sexual Activity   Alcohol use: Yes    Comment: occasional   Drug use: No   Sexual activity: Not on file  Other Topics Concern   Not on file  Social History Narrative   Not on file   Social Determinants of Health   Financial Resource Strain: Not on file  Food Insecurity: Not on file  Transportation Needs: Not on file  Physical Activity: Not on file  Stress: Not on file  Social Connections: Not on file  Intimate Partner Violence: Not on file    Family History  Problem Relation Age of Onset   Heart block Father    Cancer Sister     ROS: no fevers or chills, productive cough, hemoptysis,  dysphasia, odynophagia, melena, hematochezia, dysuria, hematuria, rash, seizure activity, orthopnea, PND, pedal edema, claudication. Remaining systems are negative.  Physical Exam:   There were no vitals taken for this visit.  General:  Well developed/well nourished in NAD Skin warm/dry Patient not depressed No peripheral clubbing Back-normal HEENT-normal/normal eyelids Neck supple/normal carotid upstroke bilaterally; no bruits; no JVD; no thyromegaly chest - CTA/ normal expansion CV - RRR/normal S1 and S2; no murmurs, rubs or gallops;  PMI nondisplaced Abdomen -NT/ND, no HSM, no mass, + bowel sounds, no bruit 2+ femoral pulses, no bruits Ext-no edema, chords, 2+ DP Neuro-grossly nonfocal  ECG - personally reviewed  A/P  1  Murmur-schedule echo to further assess.  2 CAD-pt denies CP; plan medical therapy with ASA and statin.  3 TAA-plan fu CTA 5/24.  4 hyperlipidemia- given CAD, DC zocor and treat with crestor 40 mg daily; lipids and liver 8 weeks.  5 hypertension-BP controlled; continue present meds and follow.   6 OSA-continue CPAP.   Kirk Ruths, MD

## 2022-01-22 ENCOUNTER — Encounter: Payer: Self-pay | Admitting: Cardiology

## 2022-01-22 ENCOUNTER — Ambulatory Visit: Payer: Medicare HMO | Attending: Cardiology | Admitting: Cardiology

## 2022-01-22 VITALS — BP 138/80 | HR 65 | Ht 74.0 in | Wt 223.2 lb

## 2022-01-22 DIAGNOSIS — I2510 Atherosclerotic heart disease of native coronary artery without angina pectoris: Secondary | ICD-10-CM | POA: Diagnosis not present

## 2022-01-22 DIAGNOSIS — I10 Essential (primary) hypertension: Secondary | ICD-10-CM

## 2022-01-22 DIAGNOSIS — E7800 Pure hypercholesterolemia, unspecified: Secondary | ICD-10-CM | POA: Diagnosis not present

## 2022-01-22 DIAGNOSIS — I712 Thoracic aortic aneurysm, without rupture, unspecified: Secondary | ICD-10-CM | POA: Diagnosis not present

## 2022-01-22 DIAGNOSIS — R011 Cardiac murmur, unspecified: Secondary | ICD-10-CM

## 2022-01-22 MED ORDER — LOSARTAN POTASSIUM 50 MG PO TABS: 50.0000 mg | ORAL_TABLET | Freq: Every day | ORAL | 3 refills | Status: DC

## 2022-01-22 MED ORDER — ASPIRIN 81 MG PO TBEC: 81.0000 mg | DELAYED_RELEASE_TABLET | Freq: Every day | ORAL | 3 refills | Status: AC

## 2022-01-22 NOTE — Patient Instructions (Signed)
Medication Instructions:   START ASPIRIN 81 MG ONCE DAILY  START LOSARTAN 50 MG ONCE DAILY  *If you need a refill on your cardiac medications before your next appointment, please call your pharmacy*   Lab Work:  Your physician recommends that you return for lab work in: ONE WEEK-DO NOT NEED TO FAST  If you have labs (blood work) drawn today and your tests are completely normal, you will receive your results only by: [place] (if you have MyChart) OR A paper copy in the mail If you have any lab test that is abnormal or we need to change your treatment, we will call you to review the results.   Testing/Procedures:  Your physician has requested that you have an echocardiogram. Echocardiography is a painless test that uses sound waves to create images of your heart. It provides your doctor with information about the size and shape of your heart and how well your heart's chambers and valves are working. This procedure takes approximately one hour. There are no restrictions for this procedure. Please do NOT wear cologne, perfume, aftershave, or lotions (deodorant is allowed). Please arrive 15 minutes prior to your appointment time. [facility], you and your health needs are our priority.  As part of our continuing mission to provide you with exceptional heart care, we have created designated Provider Care Teams.  These Care Teams include your primary Cardiologist (physician) and Advanced Practice Providers (APPs -  Physician Assistants and Nurse Practitioners) who all work together to provide you with the care you need, when you need it.  We recommend signing up for the patient portal called "MyChart".  Sign up information is provided on this After Visit Summary.  MyChart is used to connect with patients for Virtual Visits (Telemedicine).  Patients are able to view lab/test results, encounter notes, upcoming appointments, etc.   Non-urgent messages can be sent to your provider as well.   To learn more about what you can do with MyChart, go to [email or web address].    Your next appointment:   6 month(s)  The format for your next appointment:   In Person  Provider:   Kirk Ruths MD

## 2022-02-04 DIAGNOSIS — G4733 Obstructive sleep apnea (adult) (pediatric): Secondary | ICD-10-CM | POA: Diagnosis not present

## 2022-02-07 DIAGNOSIS — I10 Essential (primary) hypertension: Secondary | ICD-10-CM | POA: Diagnosis not present

## 2022-02-07 LAB — BASIC METABOLIC PANEL
BUN/Creatinine Ratio: 17 (ref 10–24)
BUN: 18 mg/dL (ref 8–27)
CO2: 27 mmol/L (ref 20–29)
Calcium: 9.9 mg/dL (ref 8.6–10.2)
Chloride: 101 mmol/L (ref 96–106)
Creatinine, Ser: 1.05 mg/dL (ref 0.76–1.27)
Glucose: 86 mg/dL (ref 70–99)
Potassium: 4 mmol/L (ref 3.5–5.2)
Sodium: 141 mmol/L (ref 134–144)
eGFR: 73 mL/min/{1.73_m2} (ref >59)

## 2022-02-08 ENCOUNTER — Encounter: Payer: Self-pay | Admitting: *Deleted

## 2022-02-22 ENCOUNTER — Ambulatory Visit (HOSPITAL_COMMUNITY): Payer: Medicare HMO | Attending: Internal Medicine

## 2022-02-22 DIAGNOSIS — R011 Cardiac murmur, unspecified: Secondary | ICD-10-CM | POA: Diagnosis not present

## 2022-02-22 LAB — ECHOCARDIOGRAM COMPLETE
Area-P 1/2: 4.1 cm2
S' Lateral: 3.2 cm

## 2022-02-23 DIAGNOSIS — R69 Illness, unspecified: Secondary | ICD-10-CM | POA: Diagnosis not present

## 2022-02-26 DIAGNOSIS — R69 Illness, unspecified: Secondary | ICD-10-CM | POA: Diagnosis not present

## 2022-03-07 DIAGNOSIS — G4733 Obstructive sleep apnea (adult) (pediatric): Secondary | ICD-10-CM | POA: Diagnosis not present

## 2022-04-13 DIAGNOSIS — R69 Illness, unspecified: Secondary | ICD-10-CM | POA: Diagnosis not present

## 2022-04-16 DIAGNOSIS — R69 Illness, unspecified: Secondary | ICD-10-CM | POA: Diagnosis not present

## 2022-04-23 DIAGNOSIS — R69 Illness, unspecified: Secondary | ICD-10-CM | POA: Diagnosis not present

## 2022-04-26 ENCOUNTER — Telehealth: Payer: Self-pay | Admitting: *Deleted

## 2022-04-26 NOTE — Telephone Encounter (Signed)
Spoke with pt, we had received his labs from [facility] and his LDL was 75. These labs are from 05/2021 and the patient is due for another appointment and labs 05/2022. We will wait for repeat labs to make any changes.

## 2022-05-14 DIAGNOSIS — R69 Illness, unspecified: Secondary | ICD-10-CM | POA: Diagnosis not present

## 2022-05-28 DIAGNOSIS — R69 Illness, unspecified: Secondary | ICD-10-CM | POA: Diagnosis not present

## 2022-06-01 DIAGNOSIS — R69 Illness, unspecified: Secondary | ICD-10-CM | POA: Diagnosis not present

## 2022-06-04 DIAGNOSIS — R69 Illness, unspecified: Secondary | ICD-10-CM | POA: Diagnosis not present

## 2022-06-06 ENCOUNTER — Other Ambulatory Visit: Payer: Self-pay | Admitting: *Deleted

## 2022-06-06 DIAGNOSIS — I712 Thoracic aortic aneurysm, without rupture, unspecified: Secondary | ICD-10-CM

## 2022-06-11 DIAGNOSIS — R69 Illness, unspecified: Secondary | ICD-10-CM | POA: Diagnosis not present

## 2022-06-18 DIAGNOSIS — R69 Illness, unspecified: Secondary | ICD-10-CM | POA: Diagnosis not present

## 2022-06-25 DIAGNOSIS — R69 Illness, unspecified: Secondary | ICD-10-CM | POA: Diagnosis not present

## 2022-06-29 DIAGNOSIS — E785 Hyperlipidemia, unspecified: Secondary | ICD-10-CM | POA: Diagnosis not present

## 2022-06-29 DIAGNOSIS — E559 Vitamin D deficiency, unspecified: Secondary | ICD-10-CM | POA: Diagnosis not present

## 2022-06-29 DIAGNOSIS — R69 Illness, unspecified: Secondary | ICD-10-CM | POA: Diagnosis not present

## 2022-06-29 DIAGNOSIS — N529 Male erectile dysfunction, unspecified: Secondary | ICD-10-CM | POA: Diagnosis not present

## 2022-06-29 DIAGNOSIS — Z125 Encounter for screening for malignant neoplasm of prostate: Secondary | ICD-10-CM | POA: Diagnosis not present

## 2022-06-29 DIAGNOSIS — I10 Essential (primary) hypertension: Secondary | ICD-10-CM | POA: Diagnosis not present

## 2022-07-02 DIAGNOSIS — R69 Illness, unspecified: Secondary | ICD-10-CM | POA: Diagnosis not present

## 2022-07-09 DIAGNOSIS — R69 Illness, unspecified: Secondary | ICD-10-CM | POA: Diagnosis not present

## 2022-07-11 DIAGNOSIS — G4733 Obstructive sleep apnea (adult) (pediatric): Secondary | ICD-10-CM | POA: Diagnosis not present

## 2022-07-17 DIAGNOSIS — Z1212 Encounter for screening for malignant neoplasm of rectum: Secondary | ICD-10-CM | POA: Diagnosis not present

## 2022-07-17 DIAGNOSIS — I10 Essential (primary) hypertension: Secondary | ICD-10-CM | POA: Diagnosis not present

## 2022-07-17 DIAGNOSIS — R82998 Other abnormal findings in urine: Secondary | ICD-10-CM | POA: Diagnosis not present

## 2022-07-18 DIAGNOSIS — M1990 Unspecified osteoarthritis, unspecified site: Secondary | ICD-10-CM | POA: Diagnosis not present

## 2022-07-18 DIAGNOSIS — I10 Essential (primary) hypertension: Secondary | ICD-10-CM | POA: Diagnosis not present

## 2022-07-18 DIAGNOSIS — I2510 Atherosclerotic heart disease of native coronary artery without angina pectoris: Secondary | ICD-10-CM | POA: Diagnosis not present

## 2022-07-18 DIAGNOSIS — E785 Hyperlipidemia, unspecified: Secondary | ICD-10-CM | POA: Diagnosis not present

## 2022-07-18 DIAGNOSIS — Z0000 Encounter for general adult medical examination without abnormal findings: Secondary | ICD-10-CM | POA: Diagnosis not present

## 2022-07-18 DIAGNOSIS — M5136 Other intervertebral disc degeneration, lumbar region: Secondary | ICD-10-CM | POA: Diagnosis not present

## 2022-07-18 DIAGNOSIS — I7121 Aneurysm of the ascending aorta, without rupture: Secondary | ICD-10-CM | POA: Diagnosis not present

## 2022-07-20 DIAGNOSIS — R69 Illness, unspecified: Secondary | ICD-10-CM | POA: Diagnosis not present

## 2022-07-23 DIAGNOSIS — R69 Illness, unspecified: Secondary | ICD-10-CM | POA: Diagnosis not present

## 2022-07-24 ENCOUNTER — Ambulatory Visit
Admission: RE | Admit: 2022-07-24 | Discharge: 2022-07-24 | Disposition: A | Discharge status: Home / Self Care | Payer: Medicare HMO | Source: Ambulatory Visit | Attending: Cardiology | Admitting: Cardiology

## 2022-07-24 DIAGNOSIS — I712 Thoracic aortic aneurysm, without rupture, unspecified: Secondary | ICD-10-CM

## 2022-07-24 DIAGNOSIS — I700 Atherosclerosis of aorta: Secondary | ICD-10-CM | POA: Diagnosis not present

## 2022-07-24 DIAGNOSIS — I7121 Aneurysm of the ascending aorta, without rupture: Secondary | ICD-10-CM | POA: Diagnosis not present

## 2022-07-24 MED ORDER — IOPAMIDOL (ISOVUE-370) INJECTION 76%
75.0000 mL | Freq: Once | INTRAVENOUS | Status: AC | PRN
  Administered 2022-07-24: 75 mL via INTRAVENOUS

## 2022-07-30 ENCOUNTER — Encounter: Payer: Self-pay | Admitting: *Deleted

## 2022-07-30 DIAGNOSIS — R69 Illness, unspecified: Secondary | ICD-10-CM | POA: Diagnosis not present

## 2022-08-03 DIAGNOSIS — R69 Illness, unspecified: Secondary | ICD-10-CM | POA: Diagnosis not present

## 2022-08-06 NOTE — Progress Notes (Signed)
HPI: Follow-up coronary artery disease. Ca score 5/23 306 (50 percentile); ascending aorta 4.4 cm.  Echocardiogram January 2024 showed normal LV function, grade 1 diastolic dysfunction and dilated ascending aorta at 45 mm.  CTA June 2024 showed ascending thoracic aortic aneurysm measuring 4.4 cm.  Since last seen he denies dyspnea, exertional chest pain or syncope.  Current Outpatient Medications  Medication Sig Dispense Refill   aspirin EC 81 MG tablet Take 1 tablet (81 mg total) by mouth daily. Swallow whole. 90 tablet 3   cholecalciferol (VITAMIN D) 1000 units tablet Take 1,000 Units by mouth daily.     ezetimibe (ZETIA) 10 MG tablet Take 10 mg by mouth daily.     hydrochlorothiazide (HYDRODIURIL) 25 MG tablet Take 25 mg by mouth every morning.     losartan (COZAAR) 50 MG tablet Take 1 tablet (50 mg total) by mouth daily. 90 tablet 3   Multiple Vitamin (MULTIVITAMIN WITH MINERALS) TABS tablet Take 1 tablet by mouth daily.     rosuvastatin (CRESTOR) 20 MG tablet Take 20 mg by mouth daily.     zolpidem (AMBIEN) 10 MG tablet Take 1 tablet by mouth at bedtime as needed.  5   No current facility-administered medications for this visit.     Past Medical History:  Diagnosis Date   Arthritis    Hyperlipidemia    Hypertension    Sleep apnea    cpap nightly    Past Surgical History:  Procedure Laterality Date   arthroscopic surgery Left knee yrs ago   x 2   CARPAL TUNNEL RELEASE Right 01/15/2017   Procedure: RIGHT CARPAL TUNNEL RELEASE;  Surgeon: Betha Loa, MD;  Location: [facility];  Service: Orthopedics;  Laterality: Right;   CARPAL TUNNEL RELEASE Left 03/21/2017   Procedure: LEFT CARPAL TUNNEL RELEASE;  Surgeon: Betha Loa, MD;  Location: [facility];  Service: Orthopedics;  Laterality: Left;   COLONOSCOPY WITH PROPOFOL N/A 07/28/2013   Procedure: COLONOSCOPY WITH PROPOFOL;  Surgeon: Charolett Bumpers, MD;  Location: [facility];  Service:  Endoscopy;  Laterality: N/A;   left knee     arothroscopy   surgery to chest  age 55   to bring chest wall in -"pigeon breasted"   TOTAL KNEE ARTHROPLASTY Left 02/27/2016   Procedure: TOTAL KNEE ARTHROPLASTY;  Surgeon: Dannielle Huh, MD;  Location: [facility];  Service: Orthopedics;  Laterality: Left;    Social History   Socioeconomic History   Marital status: Married    Spouse name: Not on file   Number of children: 2   Years of education: Not on file   Highest education level: Not on file  Occupational History   Not on file  Tobacco Use   Smoking status: Never   Smokeless tobacco: Never  Substance and Sexual Activity   Alcohol use: Yes    Comment: occasional   Drug use: No   Sexual activity: Not on file  Other Topics Concern   Not on file  Social History Narrative   Not on file   Social Determinants of Health   Financial Resource Strain: Not on file  Food Insecurity: Not on file  Transportation Needs: Not on file  Physical Activity: Not on file  Stress: Not on file  Social Connections: Not on file  Intimate Partner Violence: Not on file    Family History  Problem Relation Age of Onset   Heart block Father    Cancer Sister  ROS: no fevers or chills, productive cough, hemoptysis, dysphasia, odynophagia, melena, hematochezia, dysuria, hematuria, rash, seizure activity, orthopnea, PND, pedal edema, claudication. Remaining systems are negative.  Physical Exam: Well-developed well-nourished in no acute distress.  Skin is warm and dry.  HEENT is normal.  Neck is supple.  Chest is clear to auscultation with normal expansion.  Cardiovascular exam is regular rate and rhythm.  Abdominal exam nontender or distended. No masses palpated. Extremities show no edema. neuro grossly intact   A/P  1 coronary artery disease-patient is not having chest pain.  Continue aspirin and statin.  2 thoracic aortic aneurysm-plan will be to repeat CTA June 2025.  3  hypertension-patient's blood pressure is controlled.  Continue present medical regimen.  Potassium and renal function monitored by primary care.  4 hyperlipidemia-continue Crestor and Zetia.  Lipids and liver monitored by primary care.  5 obstructive sleep apnea-continue CPAP.  Olga Millers, MD

## 2022-08-11 DIAGNOSIS — G4733 Obstructive sleep apnea (adult) (pediatric): Secondary | ICD-10-CM | POA: Diagnosis not present

## 2022-08-13 DIAGNOSIS — R69 Illness, unspecified: Secondary | ICD-10-CM | POA: Diagnosis not present

## 2022-08-14 ENCOUNTER — Encounter: Payer: Self-pay | Admitting: Cardiology

## 2022-08-14 ENCOUNTER — Ambulatory Visit: Payer: Medicare HMO | Attending: Cardiology | Admitting: Cardiology

## 2022-08-14 VITALS — BP 123/86 | HR 62 | Ht 75.0 in | Wt 224.6 lb

## 2022-08-14 DIAGNOSIS — I712 Thoracic aortic aneurysm, without rupture, unspecified: Secondary | ICD-10-CM | POA: Diagnosis not present

## 2022-08-14 DIAGNOSIS — E7800 Pure hypercholesterolemia, unspecified: Secondary | ICD-10-CM | POA: Diagnosis not present

## 2022-08-14 DIAGNOSIS — I10 Essential (primary) hypertension: Secondary | ICD-10-CM

## 2022-08-14 DIAGNOSIS — I2510 Atherosclerotic heart disease of native coronary artery without angina pectoris: Secondary | ICD-10-CM | POA: Diagnosis not present

## 2022-08-14 NOTE — Patient Instructions (Signed)
  Follow-Up: At  [facility], you and your health needs are our priority.  As part of our continuing mission to provide you with exceptional heart care, we have created designated Provider Care Teams.  These Care Teams include your primary Cardiologist (physician) and Advanced Practice Providers (APPs -  Physician Assistants and Nurse Practitioners) who all work together to provide you with the care you need, when you need it.  We recommend signing up for the patient portal called "MyChart".  Sign up information is provided on this After Visit Summary.  MyChart is used to connect with patients for Virtual Visits (Telemedicine).  Patients are able to view lab/test results, encounter notes, upcoming appointments, etc.  Non-urgent messages can be sent to your provider as well.   To learn more about what you can do with MyChart, go to [email or web address].    Your next appointment:   12 month(s)  Provider:   Brian Crenshaw MD    

## 2022-08-20 DIAGNOSIS — R69 Illness, unspecified: Secondary | ICD-10-CM | POA: Diagnosis not present

## 2022-08-24 DIAGNOSIS — R69 Illness, unspecified: Secondary | ICD-10-CM | POA: Diagnosis not present

## 2022-08-30 DIAGNOSIS — R69 Illness, unspecified: Secondary | ICD-10-CM | POA: Diagnosis not present

## 2022-09-03 DIAGNOSIS — R69 Illness, unspecified: Secondary | ICD-10-CM | POA: Diagnosis not present

## 2022-09-07 DIAGNOSIS — R69 Illness, unspecified: Secondary | ICD-10-CM | POA: Diagnosis not present

## 2022-09-10 DIAGNOSIS — R69 Illness, unspecified: Secondary | ICD-10-CM | POA: Diagnosis not present

## 2022-09-10 DIAGNOSIS — G4733 Obstructive sleep apnea (adult) (pediatric): Secondary | ICD-10-CM | POA: Diagnosis not present

## 2022-09-24 DIAGNOSIS — R69 Illness, unspecified: Secondary | ICD-10-CM | POA: Diagnosis not present

## 2022-09-24 IMAGING — CT CT CARDIAC CORONARY ARTERY CALCIUM SCORE
3 series · 14 of 20 positions shown, 16 images · non-contrast
Comparison: None Available.

CLINICAL DATA: 75-year-old Caucasian male with history of
hyperlipidemia and family history of heart disease.

EXAM:
CT CARDIAC CORONARY ARTERY CALCIUM SCORE
TECHNIQUE: Non-contrast imaging through the heart was performed using
prospective ECG gating. Image post processing was performed on an
independent workstation, allowing for quantitative analysis of the
heart and coronary arteries. Note that this exam targets the heart
and the chest was not imaged in its entirety.

[Series 2: calcium scoring 2.00 qr36 bestdiast 72% hrt calciu · axial · 0.41mm/px · z∈[+1596,+1692]mm · 4 of 80 slices shown]
[im 16/80  vessel]
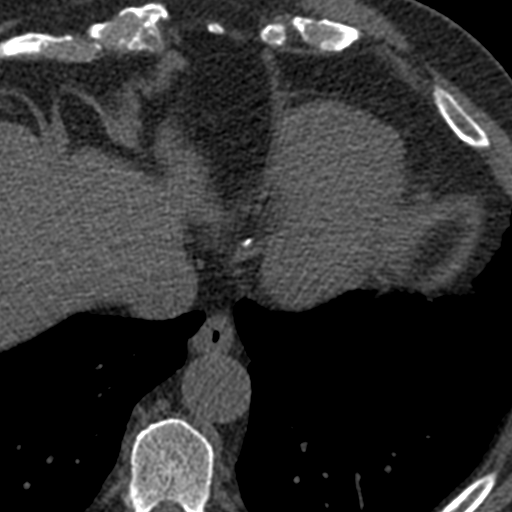
[im 32/80  vessel]
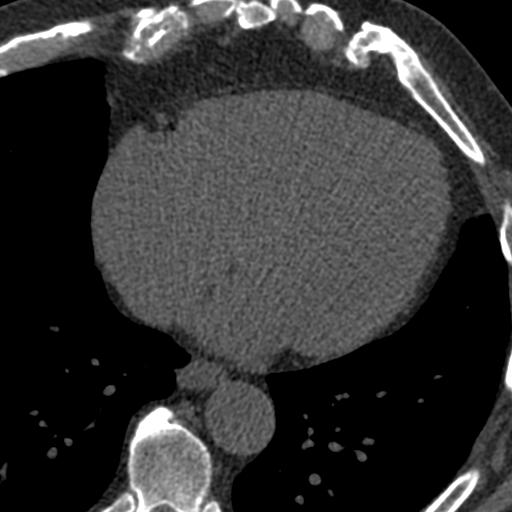
[im 48/80  vessel]
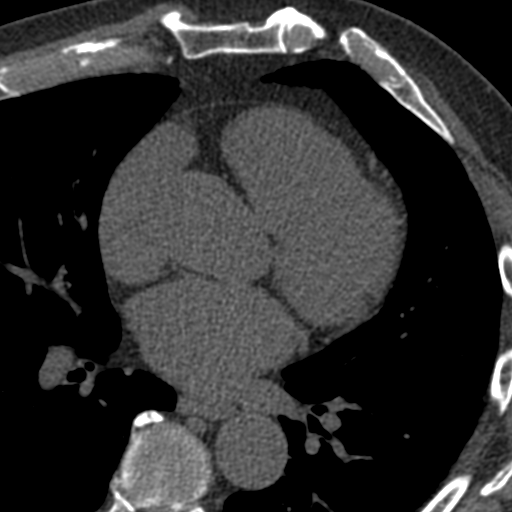
[im 64/80  vessel]
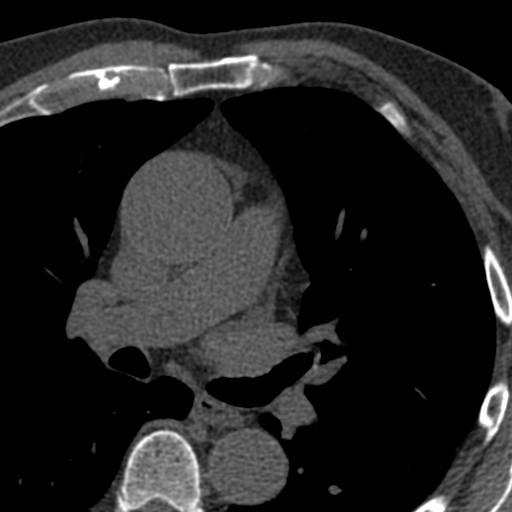

[Series 3: calcium scoring 2.00 br40 bestdiast 72% axial · axial · 0.59mm/px · z∈[+1592,+1696]mm · 5 of 80 slices shown, 7 images]
[im 14/80  vessel]
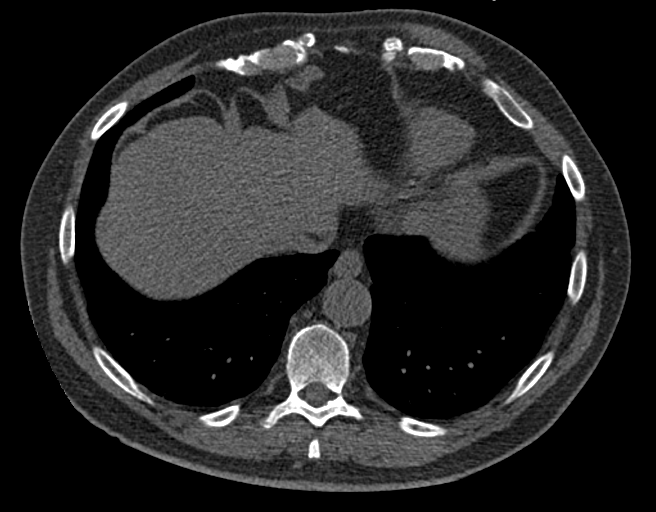
[im 14/80  lung]
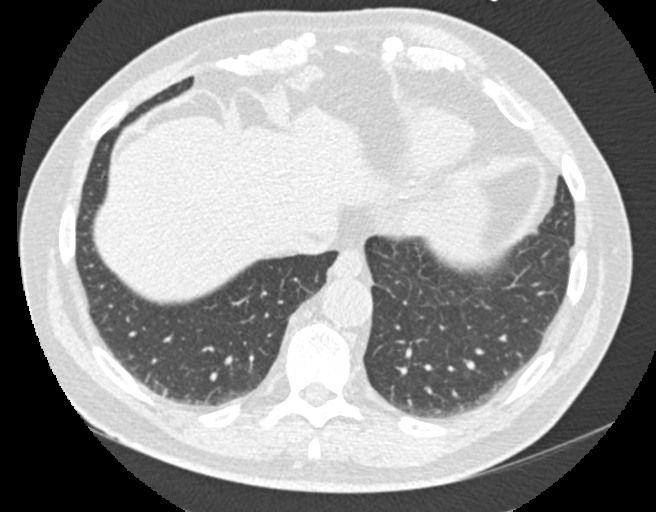
[im 27/80  vessel]
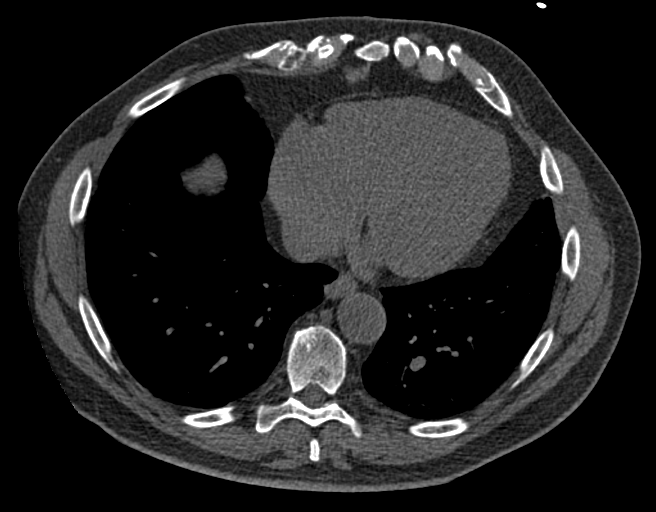
[im 40/80  vessel]
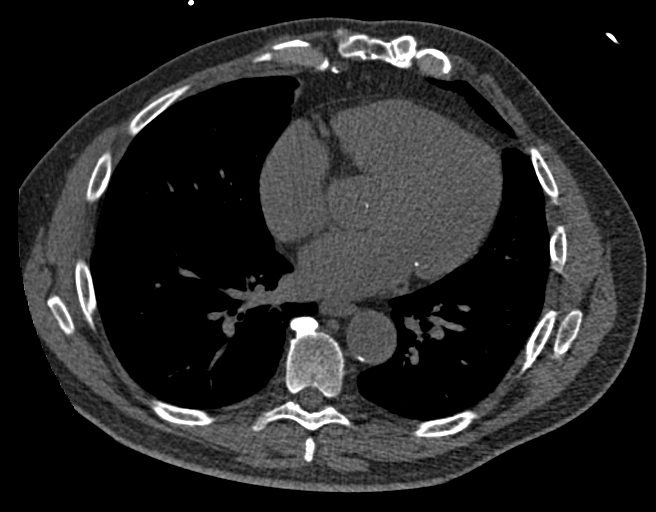
[im 53/80  vessel]
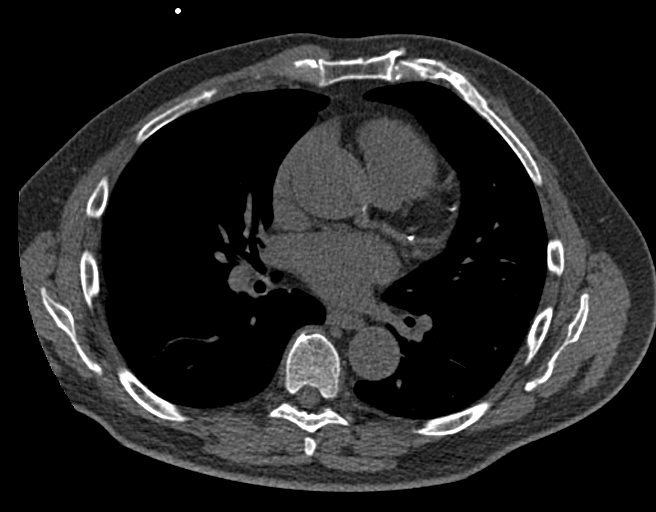
[im 66/80  vessel]
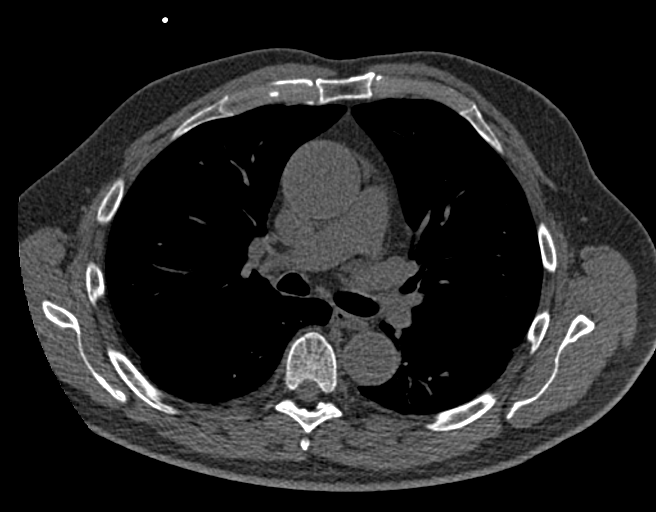
[im 66/80  lung]
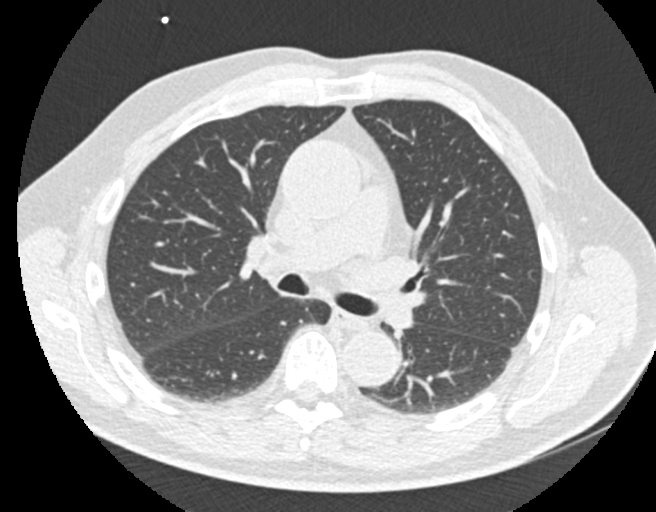

[Series 9: calcium scoring 2.00 br60 bestdiast 72% lungs · axial · 0.59mm/px · z∈[+1592,+1696]mm · 5 of 80 slices shown]
[im 14/80  vessel]
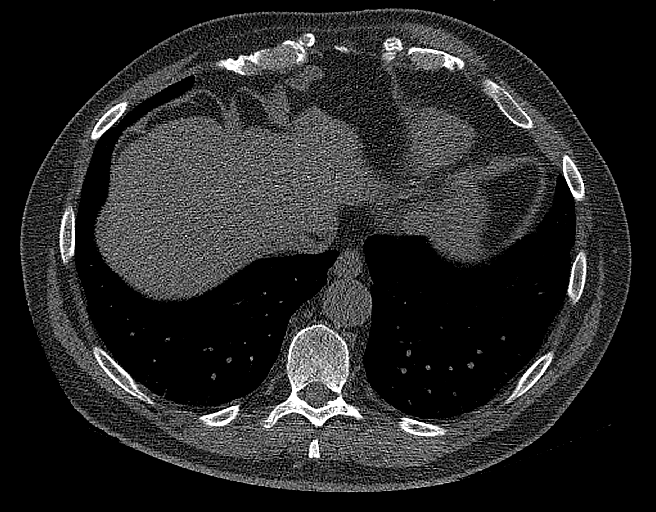
[im 27/80  vessel]
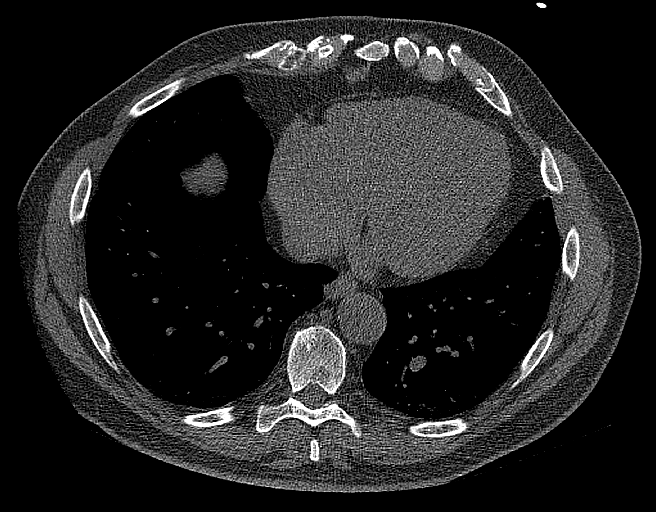
[im 40/80  vessel]
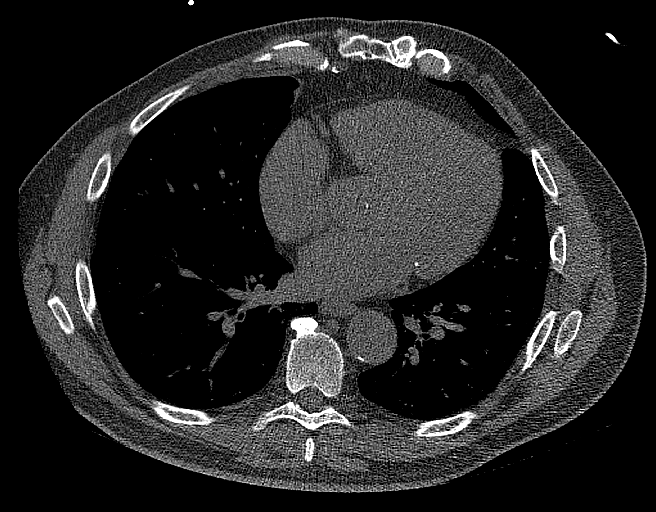
[im 53/80  vessel]
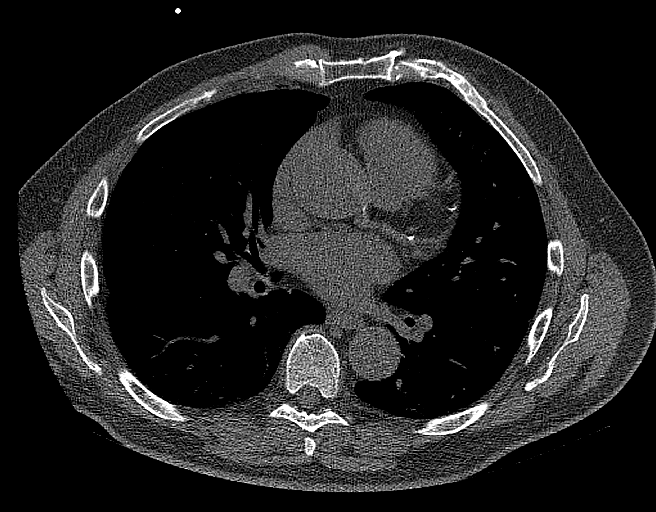
[im 66/80  vessel]
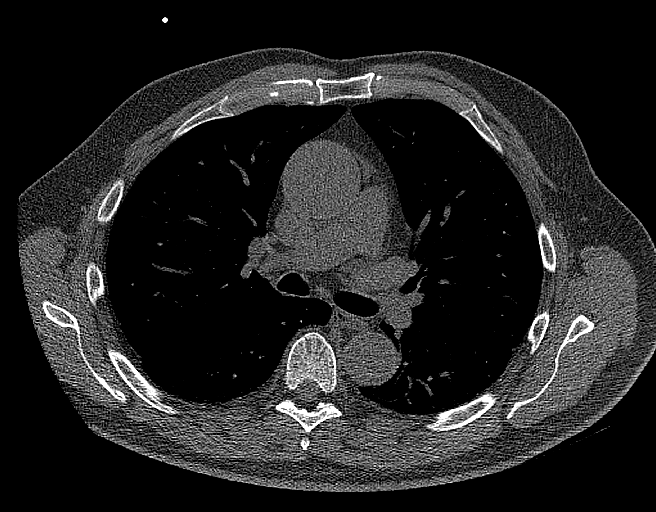

[14 of 20 positions shown; findings below may reference images not displayed]

FINDINGS: CORONARY CALCIUM SCORES:

Left Main: 0

LAD: 226

LCx:

RCA:

Total Agatston Score: 306

[HOSPITAL] percentile: 50

AORTA MEASUREMENTS:

Ascending Aorta: 44 mm

Descending Aorta: 30 mm

OTHER FINDINGS:

The heart size is within normal limits. No pericardial fluid is
identified. The ascending thoracic aorta demonstrates aneurysmal
dilatation with maximum diameter approximately 4.4 cm. Visualized
central pulmonary arteries are normal in caliber. Visualized
mediastinum and hilar regions demonstrate no lymphadenopathy or
masses. Visualized lungs show no evidence of pulmonary edema,
consolidation, pneumothorax, nodule or pleural fluid. Visualized
upper abdomen and bony structures are unremarkable.
IMPRESSION: 1. Coronary calcium score of 306 is at the 50th percentile for the
patient's sex, age and race.
2. Aneurysmal disease of the ascending thoracic aorta measuring
approximately 4.4 cm in greatest diameter by unenhanced CT.
Recommend annual imaging followup by CTA or MRA. This recommendation
follows 0898 ACCF/AHA/AATS/ACR/ASA/SCA/KEYLI/BILLIOT/DIAMANTE/ERXLEBEN Guidelines
for the Diagnosis and Management of Patients with Thoracic Aortic
Disease. Circulation. 0898; 121: [id number]. Aortic aneurysm NOS
([email or web address])

## 2022-10-01 DIAGNOSIS — R69 Illness, unspecified: Secondary | ICD-10-CM | POA: Diagnosis not present

## 2022-10-02 DIAGNOSIS — D1801 Hemangioma of skin and subcutaneous tissue: Secondary | ICD-10-CM | POA: Diagnosis not present

## 2022-10-02 DIAGNOSIS — L821 Other seborrheic keratosis: Secondary | ICD-10-CM | POA: Diagnosis not present

## 2022-10-02 DIAGNOSIS — L820 Inflamed seborrheic keratosis: Secondary | ICD-10-CM | POA: Diagnosis not present

## 2022-10-02 DIAGNOSIS — L570 Actinic keratosis: Secondary | ICD-10-CM | POA: Diagnosis not present

## 2022-10-02 DIAGNOSIS — Z85828 Personal history of other malignant neoplasm of skin: Secondary | ICD-10-CM | POA: Diagnosis not present

## 2022-10-02 DIAGNOSIS — D2262 Melanocytic nevi of left upper limb, including shoulder: Secondary | ICD-10-CM | POA: Diagnosis not present

## 2022-10-02 DIAGNOSIS — D2239 Melanocytic nevi of other parts of face: Secondary | ICD-10-CM | POA: Diagnosis not present

## 2022-10-02 DIAGNOSIS — D2261 Melanocytic nevi of right upper limb, including shoulder: Secondary | ICD-10-CM | POA: Diagnosis not present

## 2022-10-02 DIAGNOSIS — B078 Other viral warts: Secondary | ICD-10-CM | POA: Diagnosis not present

## 2022-10-08 DIAGNOSIS — R69 Illness, unspecified: Secondary | ICD-10-CM | POA: Diagnosis not present

## 2022-10-15 DIAGNOSIS — R69 Illness, unspecified: Secondary | ICD-10-CM | POA: Diagnosis not present

## 2022-12-17 ENCOUNTER — Ambulatory Visit: Payer: Medicare HMO | Admitting: Orthopaedic Surgery

## 2022-12-19 ENCOUNTER — Other Ambulatory Visit (INDEPENDENT_AMBULATORY_CARE_PROVIDER_SITE_OTHER): Payer: Medicare HMO

## 2022-12-19 ENCOUNTER — Ambulatory Visit: Payer: Medicare HMO | Admitting: Orthopaedic Surgery

## 2022-12-19 ENCOUNTER — Encounter: Payer: Self-pay | Admitting: Orthopaedic Surgery

## 2022-12-19 DIAGNOSIS — M25552 Pain in left hip: Secondary | ICD-10-CM | POA: Diagnosis not present

## 2022-12-19 DIAGNOSIS — M7062 Trochanteric bursitis, left hip: Secondary | ICD-10-CM

## 2022-12-19 MED ORDER — METHYLPREDNISOLONE ACETATE 40 MG/ML IJ SUSP
40.0000 mg | INTRAMUSCULAR | Status: AC | PRN
  Administered 2022-12-19: 40 mg via INTRA_ARTICULAR

## 2022-12-19 MED ORDER — LIDOCAINE HCL 1 % IJ SOLN
3.0000 mL | INTRAMUSCULAR | Status: AC | PRN
  Administered 2022-12-19: 3 mL

## 2022-12-19 NOTE — Progress Notes (Signed)
The patient is someone we have seen in the past.  Back in 2023 we placed a steroid injection over his left hip trochanteric area due to bursitis and tendinitis and has helped quite a bit.  Over the last month to 2 months has been having lateral hip pain again.  He denies any pain in the groin.  He is 83 and he said it was really painful even yesterday mowing the grass.  He says he cannot play golf or tennis anymore due to the pain on that left side of his hip.  On exam both hips move smoothly and fluidly with no blocks or rotation and no pain in the groin.  When I have him lay on his side his left hip has significant pain along the proximal IT band and the tip of the trochanteric area.  An AP pelvis and lateral left hip shows normal-appearing hips bilaterally in terms of the joint space.  There is slight femoral acetabular impingement.  There is no cortical irregularity around either trochanteric area.  He is still has signs and symptoms consistent with trochanteric bursitis and iliotibial tendinitis.  I did recommend a steroid injection again which she agreed to and tolerated well.  At this time I did show him stretching exercises that I want him to try twice daily and even consider Voltaren gel which she has been trying.  He is someone that is not a diabetic so I would not be opposed to repeating steroid injection in this area in 6 to 8 weeks if he is not getting better.  After that it would then be considering outpatient physical therapy and even a MRI if he is not improving.  He agrees with this treatment plan.    Procedure Note  Patient: Kevin Ramirez             Date of Birth: 03-24-1945           MRN: [id number]             Visit Date: 12/19/2022  Procedures: Visit Diagnoses:  1. Pain in left hip   2. Trochanteric bursitis, left hip     Large Joint Inj: L greater trochanter on 12/19/2022 2:49 PM Indications: pain and diagnostic evaluation Details: 22 G 1.5 in needle, lateral  approach  Arthrogram: No  Medications: 3 mL lidocaine 1 %; 40 mg methylPREDNISolone acetate 40 MG/ML Outcome: tolerated well, no immediate complications Procedure, treatment alternatives, risks and benefits explained, specific risks discussed. Consent was given by the patient. Immediately prior to procedure a time out was called to verify the correct patient, procedure, equipment, support staff and site/side marked as required. Patient was prepped and draped in the usual sterile fashion.

## 2022-12-24 ENCOUNTER — Telehealth: Payer: Self-pay | Admitting: Orthopaedic Surgery

## 2022-12-24 NOTE — Telephone Encounter (Signed)
Patient aware of the below message  

## 2022-12-24 NOTE — Telephone Encounter (Signed)
Patient called. Says he is still in pain would like a call back. [phone]

## 2023-01-24 DIAGNOSIS — Z961 Presence of intraocular lens: Secondary | ICD-10-CM | POA: Diagnosis not present

## 2023-01-24 DIAGNOSIS — H26491 Other secondary cataract, right eye: Secondary | ICD-10-CM | POA: Diagnosis not present

## 2023-02-08 ENCOUNTER — Other Ambulatory Visit: Payer: Self-pay | Admitting: Cardiology

## 2023-02-08 DIAGNOSIS — I10 Essential (primary) hypertension: Secondary | ICD-10-CM

## 2023-03-13 DIAGNOSIS — G4733 Obstructive sleep apnea (adult) (pediatric): Secondary | ICD-10-CM | POA: Diagnosis not present

## 2023-04-02 DIAGNOSIS — G4733 Obstructive sleep apnea (adult) (pediatric): Secondary | ICD-10-CM | POA: Diagnosis not present

## 2023-07-02 ENCOUNTER — Other Ambulatory Visit: Payer: Self-pay | Admitting: *Deleted

## 2023-07-02 DIAGNOSIS — I712 Thoracic aortic aneurysm, without rupture, unspecified: Secondary | ICD-10-CM

## 2023-07-30 ENCOUNTER — Ambulatory Visit (HOSPITAL_COMMUNITY)
Admission: RE | Admit: 2023-07-30 | Discharge: 2023-07-30 | Disposition: A | Discharge status: Home / Self Care | Source: Ambulatory Visit | Attending: Cardiovascular Disease | Admitting: Cardiovascular Disease

## 2023-07-30 DIAGNOSIS — I700 Atherosclerosis of aorta: Secondary | ICD-10-CM | POA: Insufficient documentation

## 2023-07-30 DIAGNOSIS — I7121 Aneurysm of the ascending aorta, without rupture: Secondary | ICD-10-CM | POA: Insufficient documentation

## 2023-07-30 DIAGNOSIS — I517 Cardiomegaly: Secondary | ICD-10-CM | POA: Insufficient documentation

## 2023-07-30 DIAGNOSIS — I2510 Atherosclerotic heart disease of native coronary artery without angina pectoris: Secondary | ICD-10-CM | POA: Diagnosis not present

## 2023-07-30 DIAGNOSIS — I712 Thoracic aortic aneurysm, without rupture, unspecified: Secondary | ICD-10-CM | POA: Diagnosis not present

## 2023-07-30 MED ORDER — IOHEXOL 350 MG/ML SOLN
75.0000 mL | Freq: Once | INTRAVENOUS | Status: AC | PRN
  Administered 2023-07-30: 75 mL via INTRAVENOUS

## 2023-08-01 ENCOUNTER — Other Ambulatory Visit (HOSPITAL_COMMUNITY)

## 2023-08-02 ENCOUNTER — Other Ambulatory Visit: Payer: Self-pay | Admitting: *Deleted

## 2023-08-02 ENCOUNTER — Ambulatory Visit: Payer: Self-pay | Admitting: Cardiology

## 2023-08-02 DIAGNOSIS — I712 Thoracic aortic aneurysm, without rupture, unspecified: Secondary | ICD-10-CM

## 2023-08-23 ENCOUNTER — Other Ambulatory Visit: Payer: Self-pay | Admitting: Cardiology

## 2023-08-23 DIAGNOSIS — I10 Essential (primary) hypertension: Secondary | ICD-10-CM

## 2023-09-02 ENCOUNTER — Other Ambulatory Visit: Payer: Self-pay | Admitting: Cardiology

## 2023-09-02 DIAGNOSIS — I10 Essential (primary) hypertension: Secondary | ICD-10-CM

## 2023-09-26 NOTE — Progress Notes (Signed)
 HPI: Follow-up coronary artery disease. Ca score 5/23 306 (50 percentile); ascending aorta 4.4 cm.  Echocardiogram January 2024 showed normal LV function, grade 1 diastolic dysfunction and dilated ascending aorta at 45 mm.  CTA June 2025 showed ascending thoracic aortic aneurysm measuring 4.5 cm.  Since last seen patient denies dyspnea, chest pain, palpitations or syncope.  Current Outpatient Medications  Medication Sig Dispense Refill   aspirin  EC 81 MG tablet Take 1 tablet (81 mg total) by mouth daily. Swallow whole. 90 tablet 3   cholecalciferol (VITAMIN D) 1000 units tablet Take 1,000 Units by mouth daily.     ezetimibe (ZETIA) 10 MG tablet Take 10 mg by mouth daily.     hydrochlorothiazide  (HYDRODIURIL ) 25 MG tablet Take 25 mg by mouth every morning.     losartan  (COZAAR ) 50 MG tablet Take 1 tablet by mouth once daily 90 tablet 0   Multiple Vitamin (MULTIVITAMIN WITH MINERALS) TABS tablet Take 1 tablet by mouth daily.     rosuvastatin (CRESTOR) 20 MG tablet Take 20 mg by mouth daily.     zolpidem  (AMBIEN ) 10 MG tablet Take 1 tablet by mouth at bedtime as needed.  5   No current facility-administered medications for this visit.     Past Medical History:  Diagnosis Date   Arthritis    Hyperlipidemia    Hypertension    Sleep apnea    cpap nightly    Past Surgical History:  Procedure Laterality Date   arthroscopic surgery Left knee yrs ago   x 2   CARPAL TUNNEL RELEASE Right 01/15/2017   Procedure: RIGHT CARPAL TUNNEL RELEASE;  Surgeon: Murrell Drivers, MD;  Location: [facility];  Service: Orthopedics;  Laterality: Right;   CARPAL TUNNEL RELEASE Left 03/21/2017   Procedure: LEFT CARPAL TUNNEL RELEASE;  Surgeon: Murrell Drivers, MD;  Location: [facility];  Service: Orthopedics;  Laterality: Left;   COLONOSCOPY WITH PROPOFOL  N/A 07/28/2013   Procedure: COLONOSCOPY WITH PROPOFOL ;  Surgeon: Gladis MARLA Louder, MD;  Location: [facility];  Service:  Endoscopy;  Laterality: N/A;   left knee     arothroscopy   surgery to chest  age 78   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   TOTAL KNEE ARTHROPLASTY Left 02/27/2016   Procedure: TOTAL KNEE ARTHROPLASTY;  Surgeon: Marcey Raman, MD;  Location: [facility];  Service: Orthopedics;  Laterality: Left;    Social History   Socioeconomic History   Marital status: Married    Spouse name: Not on file   Number of children: 2   Years of education: Not on file   Highest education level: Not on file  Occupational History   Not on file  Tobacco Use   Smoking status: Never   Smokeless tobacco: Never  Substance and Sexual Activity   Alcohol use: Yes    Comment: occasional   Drug use: No   Sexual activity: Not on file  Other Topics Concern   Not on file  Social History Narrative   Not on file   Social Drivers of Health   Financial Resource Strain: Not on file  Food Insecurity: Not on file  Transportation Needs: Not on file  Physical Activity: Not on file  Stress: Not on file  Social Connections: Not on file  Intimate Partner Violence: Not on file    Family History  Problem Relation Age of Onset   Heart block Father    Cancer Sister     ROS: no  fevers or chills, productive cough, hemoptysis, dysphasia, odynophagia, melena, hematochezia, dysuria, hematuria, rash, seizure activity, orthopnea, PND, pedal edema, claudication. Remaining systems are negative.  Physical Exam: Well-developed well-nourished in no acute distress.  Skin is warm and dry.  HEENT is normal.  Neck is supple.  Chest is clear to auscultation with normal expansion.  Cardiovascular exam is regular rate and rhythm.  Abdominal exam nontender or distended. No masses palpated. Extremities show no edema. neuro grossly intact  EKG Interpretation Date/Time:  Friday October 04 2023 08:09:41 EDT Ventricular Rate:  64 PR Interval:  154 QRS Duration:  86 QT Interval:  430 QTC Calculation: 443 R Axis:   13  Text  Interpretation: Normal sinus rhythm Normal ECG Confirmed by Pietro Rogue ([id number]) on 10/04/2023 8:12:13 AM    A/P  1 coronary artery disease-patient denies chest pain.  Continue medical therapy with aspirin  and statin.  2 thoracic aortic aneurysm-plan follow-up CTA December 2025.  Will decrease imaging frequency if aorta unchanged on follow-up study in December.  3 hyperlipidemia-continue Zetia and Crestor.  Will obtain most recent lipids and liver from primary care.  4 hypertension-blood pressure controlled.  Continue present medical regimen.  Will obtain most recent potassium and renal function from primary care.  5 obstructive sleep apnea-continue CPAP.  Rogue Pietro, MD

## 2023-10-04 ENCOUNTER — Ambulatory Visit: Attending: Cardiology | Admitting: Cardiology

## 2023-10-04 ENCOUNTER — Encounter: Payer: Self-pay | Admitting: Cardiology

## 2023-10-04 VITALS — BP 122/70 | HR 64 | Ht 75.0 in | Wt 222.0 lb

## 2023-10-04 DIAGNOSIS — I10 Essential (primary) hypertension: Secondary | ICD-10-CM

## 2023-10-04 DIAGNOSIS — I712 Thoracic aortic aneurysm, without rupture, unspecified: Secondary | ICD-10-CM | POA: Diagnosis not present

## 2023-10-04 DIAGNOSIS — E7800 Pure hypercholesterolemia, unspecified: Secondary | ICD-10-CM

## 2023-10-04 DIAGNOSIS — I2510 Atherosclerotic heart disease of native coronary artery without angina pectoris: Secondary | ICD-10-CM | POA: Diagnosis not present

## 2023-10-04 NOTE — Patient Instructions (Signed)

## 2023-10-10 DIAGNOSIS — Z1212 Encounter for screening for malignant neoplasm of rectum: Secondary | ICD-10-CM | POA: Diagnosis not present

## 2023-10-10 DIAGNOSIS — E559 Vitamin D deficiency, unspecified: Secondary | ICD-10-CM | POA: Diagnosis not present

## 2023-10-10 DIAGNOSIS — Z125 Encounter for screening for malignant neoplasm of prostate: Secondary | ICD-10-CM | POA: Diagnosis not present

## 2023-10-10 DIAGNOSIS — E785 Hyperlipidemia, unspecified: Secondary | ICD-10-CM | POA: Diagnosis not present

## 2023-10-10 DIAGNOSIS — I10 Essential (primary) hypertension: Secondary | ICD-10-CM | POA: Diagnosis not present

## 2023-10-29 DIAGNOSIS — L812 Freckles: Secondary | ICD-10-CM | POA: Diagnosis not present

## 2023-10-29 DIAGNOSIS — D225 Melanocytic nevi of trunk: Secondary | ICD-10-CM | POA: Diagnosis not present

## 2023-10-29 DIAGNOSIS — L820 Inflamed seborrheic keratosis: Secondary | ICD-10-CM | POA: Diagnosis not present

## 2023-10-29 DIAGNOSIS — L821 Other seborrheic keratosis: Secondary | ICD-10-CM | POA: Diagnosis not present

## 2023-10-29 DIAGNOSIS — Z85828 Personal history of other malignant neoplasm of skin: Secondary | ICD-10-CM | POA: Diagnosis not present

## 2023-12-06 ENCOUNTER — Other Ambulatory Visit: Payer: Self-pay | Admitting: Cardiology

## 2023-12-06 DIAGNOSIS — I10 Essential (primary) hypertension: Secondary | ICD-10-CM

## 2023-12-23 ENCOUNTER — Encounter: Payer: Self-pay | Admitting: Radiology

## 2024-02-14 ENCOUNTER — Ambulatory Visit (HOSPITAL_COMMUNITY)
Admission: RE | Admit: 2024-02-14 | Discharge: 2024-02-14 | Disposition: A | Discharge status: Home / Self Care | Source: Ambulatory Visit | Attending: Cardiology | Admitting: Cardiology

## 2024-02-14 DIAGNOSIS — I712 Thoracic aortic aneurysm, without rupture, unspecified: Secondary | ICD-10-CM | POA: Insufficient documentation

## 2024-02-14 MED ORDER — IOHEXOL 350 MG/ML SOLN
80.0000 mL | Freq: Once | INTRAVENOUS | Status: AC | PRN
  Administered 2024-02-14: 80 mL via INTRAVENOUS

## 2024-02-22 ENCOUNTER — Ambulatory Visit: Payer: Self-pay | Admitting: Cardiology

## 2024-02-22 DIAGNOSIS — I712 Thoracic aortic aneurysm, without rupture, unspecified: Secondary | ICD-10-CM

## 2025-01-19 ENCOUNTER — Other Ambulatory Visit (HOSPITAL_COMMUNITY)
